# Patient Record
Sex: Female | Born: 1985
Health system: Southern US, Community
[De-identification: ages and names within clinical notes are randomized; demographics above are authoritative.]

## PROBLEM LIST (undated history)

## (undated) DIAGNOSIS — G43909 Migraine, unspecified, not intractable, without status migrainosus: Secondary | ICD-10-CM

## (undated) DIAGNOSIS — O139 Gestational [pregnancy-induced] hypertension without significant proteinuria, unspecified trimester: Secondary | ICD-10-CM

## (undated) DIAGNOSIS — T7840XA Allergy, unspecified, initial encounter: Secondary | ICD-10-CM

## (undated) HISTORY — DX: Allergy, unspecified, initial encounter: T78.40XA

## (undated) HISTORY — DX: Migraine, unspecified, not intractable, without status migrainosus: G43.909

---

## 2009-06-16 DIAGNOSIS — O139 Gestational [pregnancy-induced] hypertension without significant proteinuria, unspecified trimester: Secondary | ICD-10-CM

## 2009-06-16 HISTORY — DX: Gestational (pregnancy-induced) hypertension without significant proteinuria, unspecified trimester: O13.9

## 2010-01-18 ENCOUNTER — Inpatient Hospital Stay (HOSPITAL_COMMUNITY): Admission: AD | Admit: 2010-01-18 | Discharge: 2010-01-19 | Payer: Self-pay | Admitting: Obstetrics and Gynecology

## 2010-04-25 ENCOUNTER — Inpatient Hospital Stay (HOSPITAL_COMMUNITY): Admission: AD | Admit: 2010-04-25 | Discharge: 2010-04-28 | Payer: Self-pay | Admitting: Obstetrics

## 2010-04-26 DIAGNOSIS — O139 Gestational [pregnancy-induced] hypertension without significant proteinuria, unspecified trimester: Secondary | ICD-10-CM

## 2010-08-27 LAB — CBC
HCT: 26.5 % — ABNORMAL LOW (ref 36.0–46.0)
HCT: 32.5 % — ABNORMAL LOW (ref 36.0–46.0)
HCT: 37.1 % (ref 36.0–46.0)
Hemoglobin: 12.9 g/dL (ref 12.0–15.0)
Hemoglobin: 9.3 g/dL — ABNORMAL LOW (ref 12.0–15.0)
Hemoglobin: 9.3 g/dL — ABNORMAL LOW (ref 12.0–15.0)
MCH: 32.7 pg (ref 26.0–34.0)
MCH: 32.7 pg (ref 26.0–34.0)
MCH: 33.3 pg (ref 26.0–34.0)
MCH: 33.5 pg (ref 26.0–34.0)
MCHC: 34.8 g/dL (ref 30.0–36.0)
MCHC: 34.9 g/dL (ref 30.0–36.0)
MCV: 93.8 fL (ref 78.0–100.0)
MCV: 95.9 fL (ref 78.0–100.0)
Platelets: 102 K/uL — ABNORMAL LOW (ref 150–400)
Platelets: 155 K/uL (ref 150–400)
RBC: 2.77 MIL/uL — ABNORMAL LOW (ref 3.87–5.11)
RBC: 3.96 MIL/uL (ref 3.87–5.11)
RDW: 12.6 % (ref 11.5–15.5)
RDW: 12.6 % (ref 11.5–15.5)
WBC: 14.2 K/uL — ABNORMAL HIGH (ref 4.0–10.5)
WBC: 16.4 K/uL — ABNORMAL HIGH (ref 4.0–10.5)
WBC: 18.7 10*3/uL — ABNORMAL HIGH (ref 4.0–10.5)

## 2010-08-27 LAB — COMPREHENSIVE METABOLIC PANEL WITH GFR
ALT: 17 U/L (ref 0–35)
ALT: 17 U/L (ref 0–35)
AST: 23 U/L (ref 0–37)
AST: 27 U/L (ref 0–37)
Albumin: 2.2 g/dL — ABNORMAL LOW (ref 3.5–5.2)
Albumin: 3 g/dL — ABNORMAL LOW (ref 3.5–5.2)
Alkaline Phosphatase: 135 U/L — ABNORMAL HIGH (ref 39–117)
Alkaline Phosphatase: 81 U/L (ref 39–117)
BUN: 3 mg/dL — ABNORMAL LOW (ref 6–23)
BUN: 9 mg/dL (ref 6–23)
CO2: 19 meq/L (ref 19–32)
CO2: 20 meq/L (ref 19–32)
Calcium: 8.4 mg/dL (ref 8.4–10.5)
Calcium: 9 mg/dL (ref 8.4–10.5)
Chloride: 107 meq/L (ref 96–112)
Chloride: 110 meq/L (ref 96–112)
Creatinine, Ser: 0.49 mg/dL (ref 0.4–1.2)
Creatinine, Ser: 0.65 mg/dL (ref 0.4–1.2)
GFR calc non Af Amer: >60 mL/min
GFR calc non Af Amer: >60 mL/min
Glucose, Bld: 70 mg/dL (ref 70–99)
Glucose, Bld: 78 mg/dL (ref 70–99)
Potassium: 3.8 meq/L (ref 3.5–5.1)
Potassium: 4.3 meq/L (ref 3.5–5.1)
Sodium: 136 meq/L (ref 135–145)
Sodium: 140 meq/L (ref 135–145)
Total Bilirubin: 0.4 mg/dL (ref 0.3–1.2)
Total Bilirubin: 0.5 mg/dL (ref 0.3–1.2)
Total Protein: 4.8 g/dL — ABNORMAL LOW (ref 6.0–8.3)
Total Protein: 6.2 g/dL (ref 6.0–8.3)

## 2010-08-27 LAB — COMPREHENSIVE METABOLIC PANEL
AST: 32 U/L (ref 0–37)
Albumin: 2.2 g/dL — ABNORMAL LOW (ref 3.5–5.2)
BUN: 8 mg/dL (ref 6–23)
CO2: 19 mEq/L (ref 19–32)
Chloride: 105 mEq/L (ref 96–112)
Creatinine, Ser: 0.64 mg/dL (ref 0.4–1.2)
GFR calc Af Amer: >60 mL/min (ref >60)
Potassium: 3.7 mEq/L (ref 3.5–5.1)
Sodium: 134 mEq/L — ABNORMAL LOW (ref 135–145)

## 2010-08-27 LAB — URIC ACID
Uric Acid, Serum: 4.6 mg/dL (ref 2.4–7.0)
Uric Acid, Serum: 5.1 mg/dL (ref 2.4–7.0)
Uric Acid, Serum: 5.1 mg/dL (ref 2.4–7.0)

## 2010-08-27 LAB — LACTATE DEHYDROGENASE: LDH: 161 U/L (ref 94–250)

## 2010-08-27 LAB — SYPHILIS: RPR W/REFLEX TO RPR TITER AND TREPONEMAL ANTIBODIES, TRADITIONAL SCREENING AND DIAGNOSIS ALGORITHM: RPR Ser Ql: NONREACTIVE

## 2010-08-30 LAB — COMPREHENSIVE METABOLIC PANEL
ALT: 18 U/L (ref 0–35)
Alkaline Phosphatase: 64 U/L (ref 39–117)
CO2: 21 mEq/L (ref 19–32)
Calcium: 8.6 mg/dL (ref 8.4–10.5)
Chloride: 105 mEq/L (ref 96–112)
Creatinine, Ser: 0.59 mg/dL (ref 0.4–1.2)
GFR calc non Af Amer: >60 mL/min (ref >60)
Potassium: 3.7 mEq/L (ref 3.5–5.1)
Sodium: 132 mEq/L — ABNORMAL LOW (ref 135–145)
Total Bilirubin: 0.5 mg/dL (ref 0.3–1.2)

## 2010-08-30 LAB — DIFFERENTIAL
Basophils Absolute: 0 10*3/uL (ref 0.0–0.1)
Basophils Relative: 0 % (ref 0–1)
Lymphs Abs: 1.4 10*3/uL (ref 0.7–4.0)
Monocytes Relative: 3 % (ref 3–12)

## 2010-08-30 LAB — CBC: MCH: 33.9 pg (ref 26.0–34.0)

## 2012-10-14 LAB — HM PAP SMEAR

## 2015-11-16 ENCOUNTER — Ambulatory Visit (INDEPENDENT_AMBULATORY_CARE_PROVIDER_SITE_OTHER): Payer: BLUE CROSS/BLUE SHIELD | Admitting: Physician Assistant

## 2015-11-16 ENCOUNTER — Encounter: Payer: Self-pay | Admitting: Physician Assistant

## 2015-11-16 VITALS — BP 124/84 | HR 75 | Ht 62.0 in | Wt 131.0 lb

## 2015-11-16 DIAGNOSIS — G43009 Migraine without aura, not intractable, without status migrainosus: Secondary | ICD-10-CM | POA: Insufficient documentation

## 2015-11-16 DIAGNOSIS — R0602 Shortness of breath: Secondary | ICD-10-CM | POA: Diagnosis not present

## 2015-11-16 DIAGNOSIS — F411 Generalized anxiety disorder: Secondary | ICD-10-CM | POA: Diagnosis not present

## 2015-11-16 MED ORDER — CITALOPRAM HYDROBROMIDE 10 MG PO TABS: 10.0000 mg | ORAL_TABLET | Freq: Every day | ORAL | Status: DC

## 2015-11-16 MED ORDER — ALBUTEROL SULFATE HFA 108 (90 BASE) MCG/ACT IN AERS: 2.0000 | INHALATION_SPRAY | Freq: Four times a day (QID) | RESPIRATORY_TRACT | Status: DC | PRN

## 2015-11-16 MED ORDER — HYDROXYZINE HCL 10 MG PO TABS: 10.0000 mg | ORAL_TABLET | Freq: Three times a day (TID) | ORAL | Status: DC | PRN

## 2015-11-16 MED ORDER — ELETRIPTAN HYDROBROMIDE 20 MG PO TABS: 20.0000 mg | ORAL_TABLET | ORAL | Status: DC | PRN

## 2015-11-16 NOTE — Progress Notes (Signed)
Subjective:    Patient ID: Crystal Armstrong, female    DOB: [birth year], 30 y.o.   MRN: [id number]  HPI  Patient is a 30 year old female who presents to the clinic to establish care.  .. Active Ambulatory Problems    Diagnosis Date Noted  . No Active Ambulatory Problems   Resolved Ambulatory Problems    Diagnosis Date Noted  . No Resolved Ambulatory Problems   Past Medical History  Diagnosis Date  . Allergy   . Migraine    .Marland Kitchen. Family History  Problem Relation Age of Onset  . Alcohol abuse Mother   . Hypertension Mother   . Cancer Father     bladder  . Hyperlipidemia Father   . Hypertension Father   . Hyperlipidemia Sister   . Diabetes Maternal Uncle   . Heart attack Paternal Aunt   . Heart attack Paternal Uncle   . Stroke Paternal Uncle   . Alcohol abuse Maternal Grandfather   . Heart attack Paternal Uncle   . Stroke Paternal Uncle   . Stroke Paternal Uncle    .Marland Kitchen. Social History   Social History  . Marital Status: Married    Spouse Name: N/A  . Number of Children: N/A  . Years of Education: N/A   Occupational History  . Not on file.   Social History Main Topics  . Smoking status: Never Smoker   . Smokeless tobacco: Not on file  . Alcohol Use: 0.0 oz/week    0 Standard drinks or equivalent per week  . Drug Use: No  . Sexual Activity: Yes   Other Topics Concern  . Not on file   Social History Narrative  . No narrative on file   She comes in wishing to switch her rescue medication from Imitrex to Relpax. Imitrex seems to make her head feel weird and her throat feels scratchy before the headache goes away. She tried a friend's Relpax and worked great with no symptoms. She proximally has 2 headaches a month. She denies any aura. She does have some nausea associated with headache. Her headaches seem to be around her ovulation and menstrual cycle. She does not actually bleed because she has the Mirena. Migraines are behind left eye.  Patient is also  concerned she could have some asthma. She does not have daily symptoms but occasionally will have some shortness of breath and chest tightness. Sometimes is associated with exercise. She has never used an inhaler or been tested for asthma. She denies any wheezing or shortness of breath today.  Patient is also having some anxiety symptoms as well. She denies any suicidal or homicidal thoughts. She denies any acute symptoms sound like she is dying. Most of her anxiety is around getting into the car for long road trips or the fear of things going wrong at work when she is not there. She has never taken any medication for anxiety. She is concerned because her mother has a addictive personality and abuse potential. She would like to stay away from controlled substances if she can.    Review of Systems  All other systems reviewed and are negative.      Objective:   Physical Exam  Constitutional: She is oriented to person, place, and time. She appears well-developed and well-nourished.  HENT:  Head: Normocephalic and atraumatic.  Cardiovascular: Normal rate, regular rhythm and normal heart sounds.   Pulmonary/Chest: Effort normal and breath sounds normal. She has no wheezes.  Neurological: She is alert and oriented  to person, place, and time.  Psychiatric: She has a normal mood and affect. Her behavior is normal.          Assessment & Plan:  Migraines- relpax to replace imitrex. Follow up as needed.   Anxiety- PHQ-9 was 4. GAD-7 was 3. vistaril given as needed. Avoid benzo as needed due to mother's addictive personality.  celexa  at bedtime started. MOA's and SE's discussed. Follow up in 4-6weeks.   SOB- peak flows were normal. Could have some episodic reactive airway disease. Given albuterol inhaler as needed. Discussed how to use.   Will get pap from [facility].

## 2015-11-20 ENCOUNTER — Encounter: Payer: Self-pay | Admitting: Physician Assistant

## 2015-12-26 ENCOUNTER — Ambulatory Visit: Payer: BLUE CROSS/BLUE SHIELD | Admitting: Physician Assistant

## 2016-01-03 ENCOUNTER — Ambulatory Visit (INDEPENDENT_AMBULATORY_CARE_PROVIDER_SITE_OTHER): Payer: BLUE CROSS/BLUE SHIELD | Admitting: Physician Assistant

## 2016-01-03 ENCOUNTER — Encounter: Payer: Self-pay | Admitting: Physician Assistant

## 2016-01-03 VITALS — BP 113/79 | HR 72 | Ht 62.0 in | Wt 127.0 lb

## 2016-01-03 DIAGNOSIS — Z13220 Encounter for screening for lipoid disorders: Secondary | ICD-10-CM

## 2016-01-03 DIAGNOSIS — R110 Nausea: Secondary | ICD-10-CM | POA: Diagnosis not present

## 2016-01-03 DIAGNOSIS — F411 Generalized anxiety disorder: Secondary | ICD-10-CM | POA: Diagnosis not present

## 2016-01-03 DIAGNOSIS — Z131 Encounter for screening for diabetes mellitus: Secondary | ICD-10-CM | POA: Diagnosis not present

## 2016-01-03 LAB — COMPLETE METABOLIC PANEL WITH GFR
ALBUMIN: 4.5 g/dL (ref 3.6–5.1)
ALK PHOS: 60 U/L (ref 33–115)
ALT: 10 U/L (ref 6–29)
AST: 13 U/L (ref 10–30)
BILIRUBIN TOTAL: 0.8 mg/dL (ref 0.2–1.2)
BUN: 8 mg/dL (ref 7–25)
CALCIUM: 9.3 mg/dL (ref 8.6–10.2)
CO2: 26 mmol/L (ref 20–31)
CREATININE: 0.91 mg/dL (ref 0.50–1.10)
Chloride: 105 mmol/L (ref 98–110)
GFR, EST NON AFRICAN AMERICAN: 85 mL/min (ref >=60)
Glucose, Bld: 88 mg/dL (ref 65–99)
Potassium: 4.4 mmol/L (ref 3.5–5.3)
Sodium: 140 mmol/L (ref 135–146)
TOTAL PROTEIN: 7.5 g/dL (ref 6.1–8.1)

## 2016-01-03 LAB — TSH: TSH: 1.73 mIU/L

## 2016-01-03 LAB — LIPID PANEL
CHOLESTEROL: 209 mg/dL — AB (ref 125–200)
HDL: 50 mg/dL (ref >=46)
LDL CALC: 128 mg/dL (ref <130)
TRIGLYCERIDES: 155 mg/dL — AB (ref <150)
Total CHOL/HDL Ratio: 4.2 Ratio (ref <=5.0)
VLDL: 31 mg/dL — ABNORMAL HIGH (ref <30)

## 2016-01-03 MED ORDER — CITALOPRAM HYDROBROMIDE 10 MG PO TABS: 10.0000 mg | ORAL_TABLET | Freq: Every day | ORAL | Status: DC

## 2016-01-03 MED ORDER — ONDANSETRON HCL 4 MG PO TABS: 4.0000 mg | ORAL_TABLET | Freq: Three times a day (TID) | ORAL | Status: DC | PRN

## 2016-01-03 NOTE — Progress Notes (Addendum)
   Subjective:    Patient ID: Crystal Armstrong, female    DOB: 04-23-1986, 30 y.o.   MRN: [id number]  HPI Pt is a 30 year old female that returns to clinic for follow up of her anxiety. She was started on Clexa 6 weeks ago. She reports taking the medication regularly at night and has noticed an improvement in mood. She denies any side effects. She reports some trouble falling asleep at night but is able to stay asleep through the night.   She also complains of nausea without vomiting during migraines. She states her migraine pain is well managed with Relpax.   Review of Systems All systems negative unless otherwise stated in the HPI    Objective:   Physical Exam  Constitutional: She is oriented to person, place, and time. She appears well-developed and well-nourished.  HENT:  Head: Normocephalic and atraumatic.  Cardiovascular: Normal rate, regular rhythm and normal heart sounds.   Pulmonary/Chest: Effort normal and breath sounds normal.  Neurological: She is alert and oriented to person, place, and time.  Psychiatric: She has a normal mood and affect. Her behavior is normal.          Assessment & Plan:  Anxiety/Insominia-  Improvement with Celexa. PHQ-9= 3. Gad-7= 4.  She has a prescription for vistaril that she has not tried. Explained that she can use the vistaril prn for nights when she is having anxiety falling asleep. Also suggested trying melatonin to aid sleep 3-10mg . Screening lipid panel, CMP and TSH ordered today. Follow up in 6 months or earlier if needed.  Nausea- associated with migraines. Migraines are well controlled with Relpax. Given Zofran to use prn with migraines

## 2016-01-04 ENCOUNTER — Encounter: Payer: Self-pay | Admitting: Physician Assistant

## 2016-01-04 DIAGNOSIS — E781 Pure hyperglyceridemia: Secondary | ICD-10-CM | POA: Insufficient documentation

## 2016-03-31 ENCOUNTER — Other Ambulatory Visit: Payer: Self-pay | Admitting: *Deleted

## 2016-03-31 MED ORDER — HYDROXYZINE HCL 10 MG PO TABS: 10.0000 mg | ORAL_TABLET | Freq: Three times a day (TID) | ORAL | 0 refills | Status: DC | PRN

## 2016-04-09 DIAGNOSIS — Z01419 Encounter for gynecological examination (general) (routine) without abnormal findings: Secondary | ICD-10-CM | POA: Diagnosis not present

## 2016-04-09 DIAGNOSIS — Z3202 Encounter for pregnancy test, result negative: Secondary | ICD-10-CM | POA: Diagnosis not present

## 2016-04-09 DIAGNOSIS — Z30432 Encounter for removal of intrauterine contraceptive device: Secondary | ICD-10-CM | POA: Diagnosis not present

## 2016-04-09 DIAGNOSIS — Z6825 Body mass index (BMI) 25.0-25.9, adult: Secondary | ICD-10-CM | POA: Diagnosis not present

## 2016-04-09 DIAGNOSIS — Z1151 Encounter for screening for human papillomavirus (HPV): Secondary | ICD-10-CM | POA: Diagnosis not present

## 2016-05-12 DIAGNOSIS — Z3201 Encounter for pregnancy test, result positive: Secondary | ICD-10-CM | POA: Diagnosis not present

## 2016-05-12 DIAGNOSIS — Z8759 Personal history of other complications of pregnancy, childbirth and the puerperium: Secondary | ICD-10-CM | POA: Diagnosis not present

## 2016-05-14 DIAGNOSIS — Z8759 Personal history of other complications of pregnancy, childbirth and the puerperium: Secondary | ICD-10-CM | POA: Diagnosis not present

## 2016-05-26 DIAGNOSIS — O020 Blighted ovum and nonhydatidiform mole: Secondary | ICD-10-CM | POA: Diagnosis not present

## 2016-06-02 DIAGNOSIS — Z3201 Encounter for pregnancy test, result positive: Secondary | ICD-10-CM | POA: Diagnosis not present

## 2016-06-02 DIAGNOSIS — Z8759 Personal history of other complications of pregnancy, childbirth and the puerperium: Secondary | ICD-10-CM | POA: Diagnosis not present

## 2016-06-24 DIAGNOSIS — Z113 Encounter for screening for infections with a predominantly sexual mode of transmission: Secondary | ICD-10-CM | POA: Diagnosis not present

## 2016-06-24 DIAGNOSIS — Z3A09 9 weeks gestation of pregnancy: Secondary | ICD-10-CM | POA: Diagnosis not present

## 2016-06-24 DIAGNOSIS — O26891 Other specified pregnancy related conditions, first trimester: Secondary | ICD-10-CM | POA: Diagnosis not present

## 2016-06-24 DIAGNOSIS — Z3689 Encounter for other specified antenatal screening: Secondary | ICD-10-CM | POA: Diagnosis not present

## 2016-06-24 DIAGNOSIS — Z368A Encounter for antenatal screening for other genetic defects: Secondary | ICD-10-CM | POA: Diagnosis not present

## 2016-06-24 DIAGNOSIS — Z3481 Encounter for supervision of other normal pregnancy, first trimester: Secondary | ICD-10-CM | POA: Diagnosis not present

## 2016-06-24 DIAGNOSIS — Z23 Encounter for immunization: Secondary | ICD-10-CM | POA: Diagnosis not present

## 2016-07-04 ENCOUNTER — Ambulatory Visit: Payer: BLUE CROSS/BLUE SHIELD | Admitting: Physician Assistant

## 2016-07-15 DIAGNOSIS — Z3682 Encounter for antenatal screening for nuchal translucency: Secondary | ICD-10-CM | POA: Diagnosis not present

## 2016-07-15 DIAGNOSIS — Z3A12 12 weeks gestation of pregnancy: Secondary | ICD-10-CM | POA: Diagnosis not present

## 2016-07-15 DIAGNOSIS — Z1379 Encounter for other screening for genetic and chromosomal anomalies: Secondary | ICD-10-CM | POA: Diagnosis not present

## 2016-08-12 DIAGNOSIS — Z36 Encounter for antenatal screening for chromosomal anomalies: Secondary | ICD-10-CM | POA: Diagnosis not present

## 2016-08-26 DIAGNOSIS — Z363 Encounter for antenatal screening for malformations: Secondary | ICD-10-CM | POA: Diagnosis not present

## 2016-08-26 DIAGNOSIS — Z3A18 18 weeks gestation of pregnancy: Secondary | ICD-10-CM | POA: Diagnosis not present

## 2016-09-09 DIAGNOSIS — O139 Gestational [pregnancy-induced] hypertension without significant proteinuria, unspecified trimester: Secondary | ICD-10-CM | POA: Diagnosis not present

## 2016-10-14 DIAGNOSIS — O139 Gestational [pregnancy-induced] hypertension without significant proteinuria, unspecified trimester: Secondary | ICD-10-CM | POA: Diagnosis not present

## 2016-10-14 DIAGNOSIS — Z36 Encounter for antenatal screening for chromosomal anomalies: Secondary | ICD-10-CM | POA: Diagnosis not present

## 2016-10-24 DIAGNOSIS — Z3483 Encounter for supervision of other normal pregnancy, third trimester: Secondary | ICD-10-CM | POA: Diagnosis not present

## 2016-10-24 DIAGNOSIS — Z3689 Encounter for other specified antenatal screening: Secondary | ICD-10-CM | POA: Diagnosis not present

## 2016-10-31 ENCOUNTER — Inpatient Hospital Stay (HOSPITAL_COMMUNITY): Payer: BLUE CROSS/BLUE SHIELD

## 2016-10-31 ENCOUNTER — Inpatient Hospital Stay (HOSPITAL_COMMUNITY)
Admission: AD | Admit: 2016-10-31 | Discharge: 2016-11-05 | DRG: 765 | Disposition: A | Discharge status: Home / Self Care | Payer: BLUE CROSS/BLUE SHIELD | Source: Ambulatory Visit | Attending: Obstetrics and Gynecology | Admitting: Obstetrics and Gynecology

## 2016-10-31 ENCOUNTER — Encounter: Payer: Self-pay | Admitting: Student

## 2016-10-31 DIAGNOSIS — O1494 Unspecified pre-eclampsia, complicating childbirth: Secondary | ICD-10-CM | POA: Diagnosis not present

## 2016-10-31 DIAGNOSIS — O1002 Pre-existing essential hypertension complicating childbirth: Secondary | ICD-10-CM | POA: Diagnosis present

## 2016-10-31 DIAGNOSIS — Z3A28 28 weeks gestation of pregnancy: Secondary | ICD-10-CM

## 2016-10-31 DIAGNOSIS — Z8249 Family history of ischemic heart disease and other diseases of the circulatory system: Secondary | ICD-10-CM

## 2016-10-31 DIAGNOSIS — O1493 Unspecified pre-eclampsia, third trimester: Secondary | ICD-10-CM | POA: Diagnosis not present

## 2016-10-31 DIAGNOSIS — D696 Thrombocytopenia, unspecified: Secondary | ICD-10-CM | POA: Diagnosis present

## 2016-10-31 DIAGNOSIS — Z833 Family history of diabetes mellitus: Secondary | ICD-10-CM

## 2016-10-31 DIAGNOSIS — Z302 Encounter for sterilization: Secondary | ICD-10-CM

## 2016-10-31 DIAGNOSIS — O133 Gestational [pregnancy-induced] hypertension without significant proteinuria, third trimester: Secondary | ICD-10-CM | POA: Diagnosis not present

## 2016-10-31 DIAGNOSIS — O1403 Mild to moderate pre-eclampsia, third trimester: Secondary | ICD-10-CM | POA: Diagnosis not present

## 2016-10-31 DIAGNOSIS — O9912 Other diseases of the blood and blood-forming organs and certain disorders involving the immune mechanism complicating childbirth: Secondary | ICD-10-CM | POA: Diagnosis not present

## 2016-10-31 DIAGNOSIS — O328XX0 Maternal care for other malpresentation of fetus, not applicable or unspecified: Secondary | ICD-10-CM | POA: Diagnosis present

## 2016-10-31 DIAGNOSIS — O113 Pre-existing hypertension with pre-eclampsia, third trimester: Secondary | ICD-10-CM | POA: Diagnosis not present

## 2016-10-31 DIAGNOSIS — O114 Pre-existing hypertension with pre-eclampsia, complicating childbirth: Principal | ICD-10-CM | POA: Diagnosis present

## 2016-10-31 DIAGNOSIS — Z98891 History of uterine scar from previous surgery: Secondary | ICD-10-CM

## 2016-10-31 DIAGNOSIS — O119 Pre-existing hypertension with pre-eclampsia, unspecified trimester: Secondary | ICD-10-CM | POA: Diagnosis present

## 2016-10-31 DIAGNOSIS — O321XX0 Maternal care for breech presentation, not applicable or unspecified: Secondary | ICD-10-CM | POA: Diagnosis not present

## 2016-10-31 DIAGNOSIS — O1203 Gestational edema, third trimester: Secondary | ICD-10-CM | POA: Diagnosis not present

## 2016-10-31 DIAGNOSIS — O1490 Unspecified pre-eclampsia, unspecified trimester: Secondary | ICD-10-CM

## 2016-10-31 HISTORY — DX: Gestational (pregnancy-induced) hypertension without significant proteinuria, unspecified trimester: O13.9

## 2016-10-31 LAB — CBC
HEMATOCRIT: 36 % (ref 36.0–46.0)
HEMOGLOBIN: 12.8 g/dL (ref 12.0–15.0)
MCH: 32 pg (ref 26.0–34.0)
MCHC: 35.6 g/dL (ref 30.0–36.0)
MCV: 90 fL (ref 78.0–100.0)
Platelets: 105 10*3/uL — ABNORMAL LOW (ref 150–400)
RBC: 4 MIL/uL (ref 3.87–5.11)
RDW: 12.3 % (ref 11.5–15.5)
WBC: 16 10*3/uL — AB (ref 4.0–10.5)

## 2016-10-31 LAB — COMPREHENSIVE METABOLIC PANEL
ALBUMIN: 2.6 g/dL — AB (ref 3.5–5.0)
ALK PHOS: 72 U/L (ref 38–126)
ALT: 25 U/L (ref 14–54)
AST: 34 U/L (ref 15–41)
Anion gap: 11 (ref 5–15)
BILIRUBIN TOTAL: 0.6 mg/dL (ref 0.3–1.2)
BUN: 16 mg/dL (ref 6–20)
CO2: 19 mmol/L — ABNORMAL LOW (ref 22–32)
CREATININE: 0.9 mg/dL (ref 0.44–1.00)
Calcium: 8.5 mg/dL — ABNORMAL LOW (ref 8.9–10.3)
Chloride: 103 mmol/L (ref 101–111)
GFR calc Af Amer: >60 mL/min (ref >60)
GLUCOSE: 87 mg/dL (ref 65–99)
POTASSIUM: 4 mmol/L (ref 3.5–5.1)
Sodium: 133 mmol/L — ABNORMAL LOW (ref 135–145)
TOTAL PROTEIN: 6.3 g/dL — AB (ref 6.5–8.1)

## 2016-10-31 LAB — TYPE AND SCREEN
ABO/RH(D): A POS
Antibody Screen: NEGATIVE

## 2016-10-31 LAB — PROTEIN / CREATININE RATIO, URINE
CREATININE, URINE: 232 mg/dL
Protein Creatinine Ratio: 3.25 mg/mg{Cre} — ABNORMAL HIGH (ref 0.00–0.15)
Total Protein, Urine: 755 mg/dL

## 2016-10-31 MED ORDER — LABETALOL HCL 200 MG PO TABS: 200.0000 mg | ORAL_TABLET | Freq: Once | ORAL | Status: DC

## 2016-10-31 MED ORDER — PRENATAL MULTIVITAMIN CH
1.0000 | ORAL_TABLET | Freq: Every day | ORAL | Status: DC
  Administered 2016-11-01: 1 via ORAL
  Filled 2016-10-31: qty 1

## 2016-10-31 MED ORDER — CALCIUM CARBONATE ANTACID 500 MG PO CHEW
2.0000 | CHEWABLE_TABLET | ORAL | Status: DC | PRN
  Administered 2016-11-01: 400 mg via ORAL
  Filled 2016-10-31: qty 2

## 2016-10-31 MED ORDER — HYDRALAZINE HCL 20 MG/ML IJ SOLN: 10.0000 mg | Freq: Once | INTRAMUSCULAR | Status: DC | PRN

## 2016-10-31 MED ORDER — BETAMETHASONE SOD PHOS & ACET 6 (3-3) MG/ML IJ SUSP
12.0000 mg | Freq: Once | INTRAMUSCULAR | Status: AC
  Administered 2016-10-31: 12 mg via INTRAMUSCULAR
  Filled 2016-10-31: qty 2

## 2016-10-31 MED ORDER — LABETALOL HCL 200 MG PO TABS
200.0000 mg | ORAL_TABLET | Freq: Three times a day (TID) | ORAL | Status: DC
  Administered 2016-10-31 – 2016-11-02 (×5): 200 mg via ORAL
  Filled 2016-10-31 (×5): qty 2

## 2016-10-31 MED ORDER — ZOLPIDEM TARTRATE 5 MG PO TABS
5.0000 mg | ORAL_TABLET | Freq: Every evening | ORAL | Status: DC | PRN
  Administered 2016-11-01: 5 mg via ORAL
  Filled 2016-10-31: qty 1

## 2016-10-31 MED ORDER — SODIUM CHLORIDE 0.9% FLUSH
3.0000 mL | Freq: Two times a day (BID) | INTRAVENOUS | Status: DC
  Administered 2016-11-01 (×2): 3 mL via INTRAVENOUS

## 2016-10-31 MED ORDER — LABETALOL HCL 5 MG/ML IV SOLN
20.0000 mg | INTRAVENOUS | Status: DC | PRN
  Administered 2016-11-01: 20 mg via INTRAVENOUS

## 2016-10-31 MED ORDER — ACETAMINOPHEN 325 MG PO TABS: 650.0000 mg | ORAL_TABLET | ORAL | Status: DC | PRN

## 2016-10-31 MED ORDER — LACTATED RINGERS IV SOLN
INTRAVENOUS | Status: DC
Start: 2016-10-31 — End: 2016-11-02
  Administered 2016-10-31 – 2016-11-01 (×2): via INTRAVENOUS

## 2016-10-31 MED ORDER — LABETALOL HCL 5 MG/ML IV SOLN
20.0000 mg | INTRAVENOUS | Status: DC | PRN
  Administered 2016-10-31: 20 mg via INTRAVENOUS
  Administered 2016-10-31: 40 mg via INTRAVENOUS
  Filled 2016-10-31: qty 4
  Filled 2016-10-31: qty 8
  Filled 2016-10-31: qty 4

## 2016-10-31 MED ORDER — DOCUSATE SODIUM 100 MG PO CAPS
100.0000 mg | ORAL_CAPSULE | Freq: Every day | ORAL | Status: DC
  Filled 2016-10-31: qty 1

## 2016-10-31 MED ORDER — BETAMETHASONE SOD PHOS & ACET 6 (3-3) MG/ML IJ SUSP: 12.0000 mg | INTRAMUSCULAR | Status: DC

## 2016-10-31 MED ORDER — SODIUM CHLORIDE 0.9% FLUSH: 3.0000 mL | INTRAVENOUS | Status: DC | PRN

## 2016-10-31 MED ORDER — SODIUM CHLORIDE 0.9 % IV SOLN: 250.0000 mL | INTRAVENOUS | Status: DC | PRN

## 2016-10-31 NOTE — MAU Provider Note (Signed)
History     CSN: [id number]  Arrival date and time: 10/31/16 1308   First Provider Initiated Contact with Patient 10/31/16 1913     Chief Complaint  Patient presents with  . Pre-Eclampsia   HPI  Crystal Armstrong is a 31 y.o. G3P1011 at 36w1dwho presents from the office for preeclampsia evaluation. Pt has history of gestational hypertension in previous pregnancy. Denies hypertension outside of pregnancy. Has been followed for elevated BPs since 20 weeks. Had labs done today that were abnormal & was sent here for further evaluation. Reports 12# weight gain in the last week & had noticed increased swelling of her legs over the last several days.  Denies headache, visual disturbance, epigastric pain, CP, or SOB. Positive fetal movement.   OB History    Gravida Para Term Preterm AB Living   '3 1 1 '$ 0 1 1   SAB TAB Ectopic Multiple Live Births   1       1      Past Medical History:  Diagnosis Date  . Allergy   . Migraine   . Pregnancy induced hypertension 2011    History reviewed. No pertinent surgical history.  Family History  Problem Relation Age of Onset  . Alcohol abuse Mother   . Hypertension Mother   . Cancer Father        bladder  . Hyperlipidemia Father   . Hypertension Father   . Hyperlipidemia Sister   . Diabetes Maternal Uncle   . Heart attack Paternal Aunt   . Heart attack Paternal Uncle   . Stroke Paternal Uncle   . Alcohol abuse Maternal Grandfather   . Heart attack Paternal Uncle   . Stroke Paternal Uncle   . Stroke Paternal Uncle     Social History  Substance Use Topics  . Smoking status: Never Smoker  . Smokeless tobacco: Never Used  . Alcohol use 0.0 oz/week     Comment: rare    Allergies: No Known Allergies  Prescriptions Prior to Admission  Medication Sig Dispense Refill Last Dose  . fexofenadine (ALLEGRA) 30 MG tablet Take 30 mg by mouth daily.   Taking  . labetalol (NORMODYNE) 200 MG tablet Take 200 mg by mouth 2 (two) times daily.  2    . Prenatal Vit-Fe Fumarate-FA (PRENATAL VITAMIN PO) Take 1 tablet by mouth daily.     . [DISCONTINUED] albuterol (PROVENTIL HFA;VENTOLIN HFA) 108 (90 Base) MCG/ACT inhaler Inhale 2 puffs into the lungs every 6 (six) hours as needed for wheezing or shortness of breath. 1 Inhaler 2 Taking  . [DISCONTINUED] citalopram (CELEXA) 10 MG tablet Take 1 tablet (10 mg total) by mouth daily. 90 tablet 1   . [DISCONTINUED] eletriptan (RELPAX) 20 MG tablet Take 1 tablet (20 mg total) by mouth as needed for migraine or headache. May repeat in 2 hours if headache persists or recurs. 10 tablet 5 Taking  . [DISCONTINUED] hydrOXYzine (ATARAX/VISTARIL) 10 MG tablet Take 1 tablet (10 mg total) by mouth every 8 (eight) hours as needed for anxiety. 30 tablet 0   . [DISCONTINUED] levonorgestrel (MIRENA) 20 MCG/24HR IUD 1 each by Intrauterine route once.   Taking  . [DISCONTINUED] ondansetron (ZOFRAN) 4 MG tablet Take 1 tablet (4 mg total) by mouth every 8 (eight) hours as needed for nausea or vomiting. Associated with migraine. 20 tablet 1     Review of Systems  Constitutional: Negative.   Eyes: Negative for visual disturbance.  Respiratory: Negative for shortness of breath.  Cardiovascular: Positive for leg swelling. Negative for chest pain.  Gastrointestinal: Negative.   Genitourinary: Negative.   Neurological: Negative for headaches.   Physical Exam   Blood pressure (!) 151/85, pulse 86, temperature 98.2 F (36.8 C), temperature source Oral, resp. rate 16, height 5\' 2"  (1.575 m), weight 180 lb 4 oz (81.8 kg), SpO2 98 %.  Temp:  [98.2 F (36.8 C)] 98.2 F (36.8 C) (05/18 1841) Pulse Rate:  [77-98] 86 (05/18 2045) Resp:  [16] 16 (05/18 1841) BP: (150-186)/(82-103) 151/85 (05/18 2045) SpO2:  [97 %-100 %] 98 % (05/18 2045) Weight:  [180 lb 4 oz (81.8 kg)] 180 lb 4 oz (81.8 kg) (05/18 1823)  Physical Exam  Nursing note and vitals reviewed. Constitutional: She is oriented to person, place, and time. She  appears well-developed and well-nourished. No distress.  HENT:  Head: Normocephalic and atraumatic.  Eyes: Conjunctivae are normal. Right eye exhibits no discharge. Left eye exhibits no discharge. No scleral icterus.  Neck: Normal range of motion.  Cardiovascular: Normal rate, regular rhythm and normal heart sounds.   No murmur heard. Respiratory: Effort normal and breath sounds normal. No respiratory distress. She has no wheezes.  GI: Soft. There is no tenderness.  Musculoskeletal: She exhibits edema (3+ pitting edema of BLE).  Neurological: She is alert and oriented to person, place, and time.  Reflex Scores:      Patellar reflexes are 3+ on the right side and 3+ on the left side. 2 beat clonus bilaterally  Skin: Skin is warm and dry. She is not diaphoretic.  Psychiatric: She has a normal mood and affect. Her behavior is normal. Judgment and thought content normal.   Fetal Tracing:  Baseline: 145 Variability: moderate Accelerations: 10x10 Decelerations: variable  Toco: none MAU Course  Procedures Results for orders placed or performed during the hospital encounter of 10/31/16 (from the past 48 hour(s))  CBC     Status: Abnormal   Collection Time: 10/31/16  6:43 PM  Result Value Ref Range   WBC 16.0 (H) 4.0 - 10.5 K/uL   RBC 4.00 3.87 - 5.11 MIL/uL   Hemoglobin 12.8 12.0 - 15.0 g/dL   HCT 11/02/16 18.8 - 41.6 %   MCV 90.0 78.0 - 100.0 fL   MCH 32.0 26.0 - 34.0 pg   MCHC 35.6 30.0 - 36.0 g/dL   RDW 60.6 30.1 - 60.1 %   Platelets 105 (L) 150 - 400 K/uL    Comment: REPEATED TO VERIFY SPECIMEN CHECKED FOR CLOTS PLATELET COUNT CONFIRMED BY SMEAR   Comprehensive metabolic panel     Status: Abnormal   Collection Time: 10/31/16  6:43 PM  Result Value Ref Range   Sodium 133 (L) 135 - 145 mmol/L   Potassium 4.0 3.5 - 5.1 mmol/L   Chloride 103 101 - 111 mmol/L   CO2 19 (L) 22 - 32 mmol/L   Glucose, Bld 87 65 - 99 mg/dL   BUN 16 6 - 20 mg/dL   Creatinine, Ser 11/02/16 0.44 - 1.00  mg/dL   Calcium 8.5 (L) 8.9 - 10.3 mg/dL   Total Protein 6.3 (L) 6.5 - 8.1 g/dL   Albumin 2.6 (L) 3.5 - 5.0 g/dL   AST 34 15 - 41 U/L   ALT 25 14 - 54 U/L   Alkaline Phosphatase 72 38 - 126 U/L   Total Bilirubin 0.6 0.3 - 1.2 mg/dL   GFR calc non Af Amer >60 >60 mL/min   GFR calc Af Amer >60 >60 mL/min  Comment: (NOTE) The eGFR has been calculated using the CKD EPI equation. This calculation has not been validated in all clinical situations. eGFR's persistently <60 mL/min signify possible Chronic Kidney Disease.    Anion gap 11 5 - 15    MDM Labetalol protocol ordered for severe range BPs CBC, CMP, urine PCR BMZ #1 given BP responded to 1 dose of labetalol ('20mg'$ ) Urine PCR pending Dr. Marylu Lund in MAU to discuss plan of care with patient Assessment and Plan  A; 1. Preeclampsia   2. [redacted] weeks gestation of pregnancy    P; Dr. Marvel Plan here to admit patient  Jorje Guild 10/31/2016, 7:14 PM

## 2016-10-31 NOTE — H&P (Signed)
Crystal Armstrong is a 31 y.o. female G3P1011 at 43 1/7 weeks (EDD 01/22/17 by 6 week [place] inconsistent with LMP)  presenting for admission given new onset preeclampsia with some intermittent severe range blood pressures and new proteinuria.   Prenatal care complicated by increasing BP since about 20 weeks, but not consistently. BP has ranged from 130-140/90-100 but today was 150-170/90-105.  In the office noted to have 4+ proteinuria and stat labs were remarkable for platelets of 96K (baseline 150-215K)  And creatinine of 1.06.  She had gestational hypertension last pregnancy but delivered at 38 weeks.    OB History    Gravida Para Term Preterm AB Living   3 1 1  0 1 1   SAB TAB Ectopic Multiple Live Births   1       1    2011 NSVD 6#13oz SABx1  Past Medical History:  Diagnosis Date  . Allergy   . Migraine   . Pregnancy induced hypertension 2011   History reviewed. No pertinent surgical history. Family History: family history includes Alcohol abuse in her maternal grandfather and mother; Cancer in her father; Diabetes in her maternal uncle; Heart attack in her paternal aunt, paternal uncle, and paternal uncle; Hyperlipidemia in her father and sister; Hypertension in her father and mother; Stroke in her paternal uncle, paternal uncle, and paternal uncle. Social History:  reports that she has never smoked. She has never used smokeless tobacco. She reports that she drinks alcohol. She reports that she does not use drugs.     Maternal Diabetes: No Genetic Screening: Normal   Panorama low risk, female Maternal Ultrasounds/Referrals: Normal Fetal Ultrasounds or other Referrals:  None Maternal Substance Abuse:  No Significant Maternal Medications:  None Significant Maternal Lab Results:  None Other Comments:  None  Review of Systems  Gastrointestinal: Negative for abdominal pain.  Neurological: Negative for headaches.   Maternal Medical History:  Contractions: Frequency: rare.   Perceived  severity is mild.    Fetal activity: Perceived fetal activity is normal.    Prenatal complications: PIH and pre-eclampsia.   Prenatal Complications - Diabetes: none.      Blood pressure (!) 151/85, pulse 86, temperature 98.2 F (36.8 C), temperature source Oral, resp. rate 16, height 5\' 2"  (1.575 m), weight 81.8 kg (180 lb 4 oz), SpO2 98 %. Maternal Exam:  Uterine Assessment: Contraction strength is mild.  Contraction frequency is rare.   Abdomen: Patient reports no abdominal tenderness. Introitus: Normal vulva. Normal vagina.    Physical Exam  Constitutional: She appears well-developed.  Cardiovascular: Normal rate and regular rhythm.   Respiratory: Effort normal.  GI: Soft.  Genitourinary: Vagina normal.  Musculoskeletal: She exhibits edema.  Neurological: She is alert.  Psychiatric: She has a normal mood and affect.    Labs repeated here and: Platelets  105K stable AST/ALT 34/25 Creatinine 0.90 Prot:creat ratio 3.25  Prenatal labs: ABO, Rh:  A Positive Antibody:  Negative Rubella: Immune RPR:   NR HBsAg:  Neg HIV:  NR  GBS:   unknown One hour GCT 114 Panorama low risk, female AFP negative Essential panel negative  Assessment/Plan: Pt with preeclampsia and severe range proteinuria.  Labs stable this PM and will repeat in AM.   BP a bit labile but most not in severe range.  Will start on labetalol 200 mg po TID and follow for response.Get [place] for growth and presentation. Pt already received first dose betamethasone and will repeat in 24 hours. NICU consult.  Will hold  off on magnesium for now unless other severe features develop or BP hard to control. D/w pt in detail need for in-house observation and probable early delivery, but will get as much time as possible for baby.  Oliver PilaKathy W Damaya Channing 10/31/2016, 9:03 PM

## 2016-10-31 NOTE — MAU Note (Signed)
[facility] with elevated BPs and protein in her urine.  She denies HA or visual changes, but states she has gained 12lbs in the past 1.5wks.  Also having epigastric pain.  Denies vaginal bleeding or contractions.

## 2016-11-01 LAB — COMPREHENSIVE METABOLIC PANEL WITH GFR
ALT: 24 U/L (ref 14–54)
ALT: 24 U/L (ref 14–54)
AST: 30 U/L (ref 15–41)
AST: 32 U/L (ref 15–41)
Albumin: 2.3 g/dL — ABNORMAL LOW (ref 3.5–5.0)
Albumin: 2.5 g/dL — ABNORMAL LOW (ref 3.5–5.0)
Alkaline Phosphatase: 69 U/L (ref 38–126)
Alkaline Phosphatase: 72 U/L (ref 38–126)
Anion gap: 8 (ref 5–15)
Anion gap: 9 (ref 5–15)
BUN: 17 mg/dL (ref 6–20)
BUN: 20 mg/dL (ref 6–20)
CO2: 20 mmol/L — ABNORMAL LOW (ref 22–32)
CO2: 20 mmol/L — ABNORMAL LOW (ref 22–32)
Calcium: 8.6 mg/dL — ABNORMAL LOW (ref 8.9–10.3)
Calcium: 9.7 mg/dL (ref 8.9–10.3)
Chloride: 105 mmol/L (ref 101–111)
Chloride: 105 mmol/L (ref 101–111)
Creatinine, Ser: 0.81 mg/dL (ref 0.44–1.00)
Creatinine, Ser: 0.85 mg/dL (ref 0.44–1.00)
GFR calc Af Amer: >60 mL/min
GFR calc Af Amer: >60 mL/min
GFR calc non Af Amer: >60 mL/min
GFR calc non Af Amer: >60 mL/min
Glucose, Bld: 113 mg/dL — ABNORMAL HIGH (ref 65–99)
Glucose, Bld: 134 mg/dL — ABNORMAL HIGH (ref 65–99)
Potassium: 4.5 mmol/L (ref 3.5–5.1)
Potassium: 4.4 mmol/L (ref 3.5–5.1)
Sodium: 133 mmol/L — ABNORMAL LOW (ref 135–145)
Sodium: 134 mmol/L — ABNORMAL LOW (ref 135–145)
Total Bilirubin: 0.4 mg/dL (ref 0.3–1.2)
Total Bilirubin: 0.6 mg/dL (ref 0.3–1.2)
Total Protein: 5.4 g/dL — ABNORMAL LOW (ref 6.5–8.1)
Total Protein: 6.1 g/dL — ABNORMAL LOW (ref 6.5–8.1)

## 2016-11-01 LAB — CBC
HCT: 33.4 % — ABNORMAL LOW (ref 36.0–46.0)
HCT: 34.2 % — ABNORMAL LOW (ref 36.0–46.0)
HEMOGLOBIN: 11.9 g/dL — AB (ref 12.0–15.0)
Hemoglobin: 11.8 g/dL — ABNORMAL LOW (ref 12.0–15.0)
MCH: 32.4 pg (ref 26.0–34.0)
MCH: 31.8 pg (ref 26.0–34.0)
MCHC: 35.6 g/dL (ref 30.0–36.0)
MCHC: 34.5 g/dL (ref 30.0–36.0)
MCV: 91 fL (ref 78.0–100.0)
MCV: 92.2 fL (ref 78.0–100.0)
PLATELETS: 96 10*3/uL — AB (ref 150–400)
Platelets: 83 10*3/uL — ABNORMAL LOW (ref 150–400)
RBC: 3.67 MIL/uL — AB (ref 3.87–5.11)
RBC: 3.71 MIL/uL — ABNORMAL LOW (ref 3.87–5.11)
RDW: 12.4 % (ref 11.5–15.5)
RDW: 12.5 % (ref 11.5–15.5)
WBC: 17.9 10*3/uL — ABNORMAL HIGH (ref 4.0–10.5)
WBC: 23.8 10*3/uL — ABNORMAL HIGH (ref 4.0–10.5)

## 2016-11-01 LAB — ABO/RH: ABO/RH(D): A POS

## 2016-11-01 MED ORDER — BETAMETHASONE SOD PHOS & ACET 6 (3-3) MG/ML IJ SUSP
12.0000 mg | Freq: Once | INTRAMUSCULAR | Status: AC
  Administered 2016-11-01: 12 mg via INTRAMUSCULAR
  Filled 2016-11-01: qty 2

## 2016-11-01 MED ORDER — PANTOPRAZOLE SODIUM 20 MG PO TBEC
20.0000 mg | DELAYED_RELEASE_TABLET | Freq: Every day | ORAL | Status: DC
  Administered 2016-11-01 – 2016-11-05 (×4): 20 mg via ORAL
  Filled 2016-11-01 (×5): qty 1

## 2016-11-01 NOTE — Progress Notes (Signed)
Patient ID: Crystal Armstrong, female   DOB: [birth year], 31 y.o.   MRN: [id number] Repeat CBC with improved Plts to 96K.  All other labs remain WNL. Pt remains asymptomatic.  BP fairly well controlled on po labetalol, has needed one extra dose of IV coverage.   NICU has been to see pt.    Will recheck labs in AM and allow pt to eat tonight to try to get at least 24 hours from second dose of betamethasone.

## 2016-11-01 NOTE — Progress Notes (Signed)
Patient ID: Earnest Conroyshley M Meegan, female   DOB: [birth year], 31 y.o.   MRN: [id number] HD #2 Preeclampsia 28 2/7 weeks/thrombocytopenia  Pt feels well. Denies HA or any other PIH symptoms BP well-controlled on po labetalol 200mg  po TID  130-140/70-90  Labs this AM stable except platelets dropped to 83K  FHR reassuring Category 1 for gestational age  US last PM 1086 grams, breech  Abdomen gravid NT  Pt with preeclampsia with decreased platelets this AM.  D/w pt in detail that we will repeat these at 4 pm and if continuing to decrease will need to proceed with delivery by c-section.  Hopefully will remain stable and allow us to get another 24 hours of betamethasone before delivery.   Will give betamethasone early today and if looks like delivery will occur will begin magnesium sulfate for both the preeclampsia and CP prophylaxis. Anesthesia contacted and aware of situation.  NPO after noon until platelets return.  Will allow intermittent monitoring q shift for now.   NICU contacted and will come and consult with patient.

## 2016-11-01 NOTE — Consult Note (Signed)
[facility]  [facility] Consult       11/01/2016  4:40 PM  I was asked by Dr. Senaida Oresichardson to consult on this patient for possible preterm delivery.  I had the pleasure of meeting with Mrs. Crystal Armstrong and her husband today.  She is a 31 y/o G3P1011 at 7928 and 2/[redacted] weeks gestation who was admitted on 5/18 for new onset preeclampsia with severe range proteinuria and mild thrombocytopenia, though platelets are stable at 96k this afternoon (lowest was 83k this morning).  She has been started on labetalol and was given a first dose of betamethasone on 5/18 and 5/19.  The fetus is a female.    I explained that the neonatal intensive care team would be present for the delivery and outlined the likely delivery room course for this baby including routine resuscitation and NRP-guided approaches to the treatment of respiratory distress. We discussed other common problems associated with prematurity including respiratory distress syndrome/CLD, apnea, feeding issues, temperature regulation, and infection risk.  We briefly discussed IVH/PVL, ROP, and NEC and that these are complications associated with prematurity, but that by 30 weeks are uncommon.    We discussed the average length of stay but I noted that the actual LOS would depend on the severity of problems encountered and response to treatments.  We discussed visitation policies and the resources available while her child is in the hospital.  We discussed the importance of good nutrition and various methods of providing nutrition (parenteral hyperalimentation, gavage feedings and/or oral feeding). We discussed the benefits of human milk. I encouraged breast feeding and pumping soon after birth and outlined resources that are available to support breast feeding.  She does intend on providing breast milk.    Thank you for involving us in the care of this patient. A member of our team will be available should the family have additional questions.  Time  for consultation approximately 45 minutes.   _____________________ Electronically Signed By: Maryan CharLindsey Alantra Popoca, MD Neonatologist

## 2016-11-02 ENCOUNTER — Inpatient Hospital Stay (HOSPITAL_COMMUNITY): Payer: BLUE CROSS/BLUE SHIELD | Admitting: Anesthesiology

## 2016-11-02 ENCOUNTER — Encounter (HOSPITAL_COMMUNITY)
Admission: AD | Disposition: A | Discharge status: Home / Self Care | Payer: Self-pay | Source: Ambulatory Visit | Attending: Obstetrics and Gynecology

## 2016-11-02 ENCOUNTER — Encounter (HOSPITAL_COMMUNITY): Payer: Self-pay | Admitting: *Deleted

## 2016-11-02 DIAGNOSIS — Z98891 History of uterine scar from previous surgery: Secondary | ICD-10-CM

## 2016-11-02 LAB — COMPREHENSIVE METABOLIC PANEL
ALBUMIN: 2.3 g/dL — AB (ref 3.5–5.0)
ALK PHOS: 61 U/L (ref 38–126)
ALT: 22 U/L (ref 14–54)
AST: 30 U/L (ref 15–41)
Anion gap: 11 (ref 5–15)
BILIRUBIN TOTAL: 0.5 mg/dL (ref 0.3–1.2)
BUN: 23 mg/dL — AB (ref 6–20)
CALCIUM: 8.9 mg/dL (ref 8.9–10.3)
CO2: 17 mmol/L — ABNORMAL LOW (ref 22–32)
CREATININE: 0.82 mg/dL (ref 0.44–1.00)
Chloride: 107 mmol/L (ref 101–111)
GFR calc Af Amer: >60 mL/min (ref >60)
GLUCOSE: 112 mg/dL — AB (ref 65–99)
Potassium: 4.3 mmol/L (ref 3.5–5.1)
Sodium: 135 mmol/L (ref 135–145)
TOTAL PROTEIN: 5.1 g/dL — AB (ref 6.5–8.1)

## 2016-11-02 LAB — CBC
HEMATOCRIT: 32.4 % — AB (ref 36.0–46.0)
HEMOGLOBIN: 11.4 g/dL — AB (ref 12.0–15.0)
MCH: 32.7 pg (ref 26.0–34.0)
MCHC: 35.2 g/dL (ref 30.0–36.0)
MCV: 92.8 fL (ref 78.0–100.0)
Platelets: 99 10*3/uL — ABNORMAL LOW (ref 150–400)
RBC: 3.49 MIL/uL — ABNORMAL LOW (ref 3.87–5.11)
RDW: 12.8 % (ref 11.5–15.5)
WBC: 22.8 10*3/uL — AB (ref 4.0–10.5)

## 2016-11-02 SURGERY — Surgical Case
Anesthesia: Spinal

## 2016-11-02 MED ORDER — ONDANSETRON HCL 4 MG/2ML IJ SOLN
INTRAMUSCULAR | Status: DC | PRN
Start: 2016-11-02 — End: 2016-11-02
  Administered 2016-11-02: 4 mg via INTRAVENOUS

## 2016-11-02 MED ORDER — PHENYLEPHRINE 8 MG IN D5W 100 ML (0.08MG/ML) PREMIX OPTIME
INJECTION | INTRAVENOUS | Status: AC
  Filled 2016-11-02: qty 100

## 2016-11-02 MED ORDER — FENTANYL CITRATE (PF) 100 MCG/2ML IJ SOLN
INTRAMUSCULAR | Status: AC
  Filled 2016-11-02: qty 2

## 2016-11-02 MED ORDER — ONDANSETRON HCL 4 MG/2ML IJ SOLN: 4.0000 mg | Freq: Three times a day (TID) | INTRAMUSCULAR | Status: DC | PRN

## 2016-11-02 MED ORDER — FENTANYL CITRATE (PF) 100 MCG/2ML IJ SOLN
INTRAMUSCULAR | Status: DC | PRN
  Administered 2016-11-02: 12.5 ug via INTRATHECAL

## 2016-11-02 MED ORDER — MEPERIDINE HCL 25 MG/ML IJ SOLN: 6.2500 mg | INTRAMUSCULAR | Status: DC | PRN

## 2016-11-02 MED ORDER — DIPHENHYDRAMINE HCL 50 MG/ML IJ SOLN: 12.5000 mg | INTRAMUSCULAR | Status: DC | PRN

## 2016-11-02 MED ORDER — LACTATED RINGERS IV SOLN
INTRAVENOUS | Status: DC | PRN
  Administered 2016-11-02: 12:00:00 via INTRAVENOUS

## 2016-11-02 MED ORDER — ONDANSETRON HCL 4 MG/2ML IJ SOLN
INTRAMUSCULAR | Status: AC
  Filled 2016-11-02: qty 2

## 2016-11-02 MED ORDER — OXYTOCIN 10 UNIT/ML IJ SOLN
INTRAVENOUS | Status: DC | PRN
  Administered 2016-11-02: 40 [IU] via INTRAVENOUS

## 2016-11-02 MED ORDER — MORPHINE SULFATE (PF) 0.5 MG/ML IJ SOLN
INTRAMUSCULAR | Status: DC | PRN
  Administered 2016-11-02: .2 mg via INTRATHECAL

## 2016-11-02 MED ORDER — NALOXONE HCL 2 MG/2ML IJ SOSY
1.0000 ug/kg/h | PREFILLED_SYRINGE | INTRAMUSCULAR | Status: DC | PRN
  Filled 2016-11-02: qty 2

## 2016-11-02 MED ORDER — OXYCODONE HCL 5 MG PO TABS
10.0000 mg | ORAL_TABLET | ORAL | Status: DC | PRN
Start: 2016-11-02 — End: 2016-11-05
  Administered 2016-11-03 – 2016-11-04 (×4): 10 mg via ORAL
  Filled 2016-11-02 (×4): qty 2

## 2016-11-02 MED ORDER — HYDRALAZINE HCL 20 MG/ML IJ SOLN: 10.0000 mg | Freq: Once | INTRAMUSCULAR | Status: DC | PRN

## 2016-11-02 MED ORDER — MENTHOL 3 MG MT LOZG: 1.0000 | LOZENGE | OROMUCOSAL | Status: DC | PRN

## 2016-11-02 MED ORDER — BUPIVACAINE IN DEXTROSE 0.75-8.25 % IT SOLN
INTRATHECAL | Status: AC
  Filled 2016-11-02: qty 2

## 2016-11-02 MED ORDER — SENNOSIDES-DOCUSATE SODIUM 8.6-50 MG PO TABS
2.0000 | ORAL_TABLET | ORAL | Status: DC
  Administered 2016-11-02 – 2016-11-03 (×2): 2 via ORAL
  Filled 2016-11-02 (×2): qty 2

## 2016-11-02 MED ORDER — NALBUPHINE HCL 10 MG/ML IJ SOLN: 5.0000 mg | INTRAMUSCULAR | Status: DC | PRN

## 2016-11-02 MED ORDER — LABETALOL HCL 5 MG/ML IV SOLN: 20.0000 mg | INTRAVENOUS | Status: DC | PRN

## 2016-11-02 MED ORDER — OXYCODONE HCL 5 MG PO TABS
5.0000 mg | ORAL_TABLET | ORAL | Status: DC | PRN
  Administered 2016-11-04: 5 mg via ORAL
  Filled 2016-11-02: qty 1

## 2016-11-02 MED ORDER — ZOLPIDEM TARTRATE 5 MG PO TABS: 5.0000 mg | ORAL_TABLET | Freq: Every evening | ORAL | Status: DC | PRN

## 2016-11-02 MED ORDER — OXYTOCIN 40 UNITS IN LACTATED RINGERS INFUSION - SIMPLE MED: 2.5000 [IU]/h | INTRAVENOUS | Status: AC

## 2016-11-02 MED ORDER — SCOPOLAMINE 1 MG/3DAYS TD PT72
1.0000 | MEDICATED_PATCH | Freq: Once | TRANSDERMAL | Status: DC
  Filled 2016-11-02: qty 1

## 2016-11-02 MED ORDER — MAGNESIUM SULFATE 40 G IN LACTATED RINGERS - SIMPLE
2.0000 g/h | INTRAVENOUS | Status: DC
  Administered 2016-11-03: 2 g/h via INTRAVENOUS
  Filled 2016-11-02: qty 500
  Filled 2016-11-02: qty 40

## 2016-11-02 MED ORDER — PRENATAL MULTIVITAMIN CH
1.0000 | ORAL_TABLET | Freq: Every day | ORAL | Status: DC
  Administered 2016-11-03 – 2016-11-04 (×2): 1 via ORAL
  Filled 2016-11-02 (×2): qty 1

## 2016-11-02 MED ORDER — NALOXONE HCL 0.4 MG/ML IJ SOLN
0.4000 mg | INTRAMUSCULAR | Status: DC | PRN
Start: 2016-11-02 — End: 2016-11-05

## 2016-11-02 MED ORDER — SODIUM CHLORIDE 0.9% FLUSH: 3.0000 mL | INTRAVENOUS | Status: DC | PRN

## 2016-11-02 MED ORDER — COCONUT OIL OIL: 1.0000 "application " | TOPICAL_OIL | Status: DC | PRN

## 2016-11-02 MED ORDER — DIPHENHYDRAMINE HCL 25 MG PO CAPS: 25.0000 mg | ORAL_CAPSULE | Freq: Four times a day (QID) | ORAL | Status: DC | PRN

## 2016-11-02 MED ORDER — NALBUPHINE HCL 10 MG/ML IJ SOLN: 5.0000 mg | Freq: Once | INTRAMUSCULAR | Status: DC | PRN

## 2016-11-02 MED ORDER — OXYTOCIN 10 UNIT/ML IJ SOLN
INTRAMUSCULAR | Status: AC
  Filled 2016-11-02: qty 4

## 2016-11-02 MED ORDER — DIPHENHYDRAMINE HCL 25 MG PO CAPS
25.0000 mg | ORAL_CAPSULE | ORAL | Status: DC | PRN
  Administered 2016-11-02: 25 mg via ORAL
  Filled 2016-11-02: qty 1

## 2016-11-02 MED ORDER — MAGNESIUM SULFATE BOLUS VIA INFUSION
6.0000 g | Freq: Once | INTRAVENOUS | Status: AC
  Administered 2016-11-02 (×2): 6 g via INTRAVENOUS
  Filled 2016-11-02: qty 500

## 2016-11-02 MED ORDER — NALBUPHINE HCL 10 MG/ML IJ SOLN
5.0000 mg | INTRAMUSCULAR | Status: DC | PRN
Start: 2016-11-02 — End: 2016-11-05

## 2016-11-02 MED ORDER — WITCH HAZEL-GLYCERIN EX PADS: 1.0000 "application " | MEDICATED_PAD | CUTANEOUS | Status: DC | PRN

## 2016-11-02 MED ORDER — CEFAZOLIN SODIUM-DEXTROSE 2-4 GM/100ML-% IV SOLN
2.0000 g | Freq: Once | INTRAVENOUS | Status: AC
  Administered 2016-11-02: 2 g via INTRAVENOUS
  Filled 2016-11-02: qty 100

## 2016-11-02 MED ORDER — SIMETHICONE 80 MG PO CHEW
80.0000 mg | CHEWABLE_TABLET | Freq: Three times a day (TID) | ORAL | Status: DC
  Administered 2016-11-02 – 2016-11-05 (×7): 80 mg via ORAL
  Filled 2016-11-02 (×7): qty 1

## 2016-11-02 MED ORDER — LACTATED RINGERS IV SOLN: INTRAVENOUS | Status: DC

## 2016-11-02 MED ORDER — LIDOCAINE HCL (PF) 1 % IJ SOLN
INTRAMUSCULAR | Status: AC
  Filled 2016-11-02: qty 5

## 2016-11-02 MED ORDER — ACETAMINOPHEN 325 MG PO TABS
650.0000 mg | ORAL_TABLET | ORAL | Status: DC | PRN
  Administered 2016-11-02 – 2016-11-05 (×4): 650 mg via ORAL
  Filled 2016-11-02 (×4): qty 2

## 2016-11-02 MED ORDER — SOD CITRATE-CITRIC ACID 500-334 MG/5ML PO SOLN
ORAL | Status: AC
Start: 2016-11-02 — End: 2016-11-02
  Administered 2016-11-02: 30 mL
  Filled 2016-11-02: qty 15

## 2016-11-02 MED ORDER — MORPHINE SULFATE (PF) 0.5 MG/ML IJ SOLN
INTRAMUSCULAR | Status: AC
  Filled 2016-11-02: qty 10

## 2016-11-02 MED ORDER — PHENYLEPHRINE 8 MG IN D5W 100 ML (0.08MG/ML) PREMIX OPTIME
INJECTION | INTRAVENOUS | Status: DC | PRN
Start: 2016-11-02 — End: 2016-11-02
  Administered 2016-11-02: 60 ug/min via INTRAVENOUS

## 2016-11-02 MED ORDER — LACTATED RINGERS IV SOLN
INTRAVENOUS | Status: DC
  Administered 2016-11-02: 50 mL/h via INTRAVENOUS

## 2016-11-02 MED ORDER — ACETAMINOPHEN 160 MG/5ML PO SOLN
960.0000 mg | Freq: Once | ORAL | Status: AC
  Administered 2016-11-02: 960 mg via ORAL
  Filled 2016-11-02: qty 40.6

## 2016-11-02 MED ORDER — TETANUS-DIPHTH-ACELL PERTUSSIS 5-2.5-18.5 LF-MCG/0.5 IM SUSP
0.5000 mL | Freq: Once | INTRAMUSCULAR | Status: AC
  Administered 2016-11-03: 0.5 mL via INTRAMUSCULAR
  Filled 2016-11-02: qty 0.5

## 2016-11-02 MED ORDER — SIMETHICONE 80 MG PO CHEW
80.0000 mg | CHEWABLE_TABLET | ORAL | Status: DC
  Administered 2016-11-02 – 2016-11-03 (×2): 80 mg via ORAL
  Filled 2016-11-02 (×2): qty 1

## 2016-11-02 MED ORDER — DIBUCAINE 1 % RE OINT: 1.0000 "application " | TOPICAL_OINTMENT | RECTAL | Status: DC | PRN

## 2016-11-02 MED ORDER — SIMETHICONE 80 MG PO CHEW: 80.0000 mg | CHEWABLE_TABLET | ORAL | Status: DC | PRN

## 2016-11-02 SURGICAL SUPPLY — 37 items
BENZOIN TINCTURE PRP APPL 2/3 (GAUZE/BANDAGES/DRESSINGS) ×2 IMPLANT
CHLORAPREP W/TINT 26ML (MISCELLANEOUS) ×2 IMPLANT
CLAMP CORD UMBIL (MISCELLANEOUS) IMPLANT
CLOSURE STERI STRIP 1/2 X4 (GAUZE/BANDAGES/DRESSINGS) ×2 IMPLANT
CLOTH BEACON ORANGE TIMEOUT ST (SAFETY) ×2 IMPLANT
CONTAINER PREFILL 10% NBF 15ML (MISCELLANEOUS) ×4 IMPLANT
DRSG OPSITE POSTOP 4X10 (GAUZE/BANDAGES/DRESSINGS) ×2 IMPLANT
ELECT REM PT RETURN 9FT ADLT (ELECTROSURGICAL) ×2
ELECTRODE REM PT RTRN 9FT ADLT (ELECTROSURGICAL) ×1 IMPLANT
EXTRACTOR VACUUM KIWI (MISCELLANEOUS) IMPLANT
GLOVE BIO SURGEON STRL SZ 6.5 (GLOVE) ×2 IMPLANT
GLOVE BIOGEL PI IND STRL 7.0 (GLOVE) ×1 IMPLANT
GLOVE BIOGEL PI INDICATOR 7.0 (GLOVE) ×1
GOWN STRL REUS W/TWL LRG LVL3 (GOWN DISPOSABLE) ×4 IMPLANT
KIT ABG SYR 3ML LUER SLIP (SYRINGE) ×2 IMPLANT
NEEDLE HYPO 25X5/8 SAFETYGLIDE (NEEDLE) ×2 IMPLANT
NS IRRIG 1000ML POUR BTL (IV SOLUTION) ×2 IMPLANT
PACK C SECTION WH (CUSTOM PROCEDURE TRAY) ×2 IMPLANT
PAD OB MATERNITY 4.3X12.25 (PERSONAL CARE ITEMS) ×2 IMPLANT
PENCIL SMOKE EVAC W/HOLSTER (ELECTROSURGICAL) ×2 IMPLANT
RTRCTR C-SECT PINK 25CM LRG (MISCELLANEOUS) ×2 IMPLANT
STRIP CLOSURE SKIN 1/2X4 (GAUZE/BANDAGES/DRESSINGS) IMPLANT
SUT CHROMIC 1 CTX 36 (SUTURE) ×4 IMPLANT
SUT PLAIN 0 NONE (SUTURE) IMPLANT
SUT PLAIN 2 0 XLH (SUTURE) ×2 IMPLANT
SUT VIC AB 0 CT1 27 (SUTURE) ×2
SUT VIC AB 0 CT1 27XBRD ANBCTR (SUTURE) ×2 IMPLANT
SUT VIC AB 2-0 CT1 (SUTURE) ×4 IMPLANT
SUT VIC AB 2-0 CT1 27 (SUTURE) ×1
SUT VIC AB 2-0 CT1 TAPERPNT 27 (SUTURE) ×1 IMPLANT
SUT VIC AB 3-0 CT1 27 (SUTURE)
SUT VIC AB 3-0 CT1 TAPERPNT 27 (SUTURE) IMPLANT
SUT VIC AB 3-0 SH 27 (SUTURE) ×1
SUT VIC AB 3-0 SH 27X BRD (SUTURE) ×1 IMPLANT
SUT VIC AB 4-0 KS 27 (SUTURE) ×2 IMPLANT
TOWEL OR 17X24 6PK STRL BLUE (TOWEL DISPOSABLE) ×2 IMPLANT
TRAY FOLEY BAG SILVER LF 14FR (SET/KITS/TRAYS/PACK) ×2 IMPLANT

## 2016-11-02 NOTE — Anesthesia Preprocedure Evaluation (Signed)
Anesthesia Evaluation  Patient identified by MRN, date of birth, ID band Patient awake    Reviewed: Allergy & Precautions, H&P , Patient's Chart, lab work & pertinent test results, reviewed documented beta blocker date and time   Airway Mallampati: II  TM Distance: >3 FB Neck ROM: full    Dental no notable dental hx.    Pulmonary    Pulmonary exam normal breath sounds clear to auscultation       Cardiovascular hypertension,  Rhythm:regular Rate:Normal     Neuro/Psych    GI/Hepatic   Endo/Other    Renal/GU      Musculoskeletal   Abdominal   Peds  Hematology   Anesthesia Other Findings   Reproductive/Obstetrics                             Anesthesia Physical Anesthesia Plan  ASA: III  Anesthesia Plan: Spinal   Post-op Pain Management:    Induction:   Airway Management Planned:   Additional Equipment:   Intra-op Plan:   Post-operative Plan:   Informed Consent: I have reviewed the patients History and Physical, chart, labs and discussed the procedure including the risks, benefits and alternatives for the proposed anesthesia with the patient or authorized representative who has indicated his/her understanding and acceptance.   Dental Advisory Given  Plan Discussed with: CRNA and Surgeon  Anesthesia Plan Comments: (  )        Anesthesia Quick Evaluation

## 2016-11-02 NOTE — Anesthesia Procedure Notes (Addendum)
Spinal  Patient location during procedure: OR Staffing Anesthesiologist: Cristela BlueJACKSON, Drema Eddington Preanesthetic Checklist Completed: patient identified, site marked, surgical consent, pre-op evaluation, timeout performed, IV checked, risks and benefits discussed and monitors and equipment checked Spinal Block Patient position: sitting Prep: DuraPrep Patient monitoring: heart rate, cardiac monitor, continuous pulse ox and blood pressure Approach: midline Location: L3-4 Injection technique: single-shot Needle Needle type: Sprotte  Needle gauge: 24 G Needle length: 9 cm Assessment Sensory level: T4 Additional Notes Spinal Dosage in OR  .75% Bupivicaine ml       1.3     [id number]   mcg        200    Fentanyl mcg           12.5

## 2016-11-02 NOTE — Transfer of Care (Signed)
Immediate Anesthesia Transfer of Care Note  Patient: Earnest Conroyshley M Keziah  Procedure(s) Performed: Procedure(s): CESAREAN SECTION (N/A)  Patient Location: PACU  Anesthesia Type:Spinal  Level of Consciousness: awake, alert  and oriented  Airway & Oxygen Therapy: Patient Spontanous Breathing  Post-op Assessment: Report given to RN and Post -op Vital signs reviewed and stable  Post vital signs: Reviewed and stable  Last Vitals:  Vitals:   11/02/16 1031 11/02/16 1242  BP: (!) 154/83 135/75  Pulse: 100 87  Resp:  20  Temp:      Last Pain:  Vitals:   11/02/16 1014  TempSrc:   PainSc: 0-No pain      Patients Stated Pain Goal: 3 (11/02/16 1014)  Complications: No apparent anesthesia complications

## 2016-11-02 NOTE — Op Note (Signed)
Operative Note    Preoperative Diagnosis Preterm pregnancy at 28 3/7 weeks Severe preeclampsia with thrombocytopenia Breech presentation Desires permanent sterility  Postoperative Diagnosis same  Procedure Primary transverse c-section with 2-layer closure of uterus and bilateral distal salpingectomies  Surgeon Huel Cote, MD  Anesthesia Spinal  Fluids: EBL UOP clear IVF  Findings A viable female infant in the footling breech presentation.  Apgars 5,7.  Weight 2#6oz Normal uterus tubes and ovaries.  Specimen Placenta and distal tube segments  Procedure Note  Patient was taken to the operating room where spinal anesthesia was obtained and found to be adequate by Allis clamp test. She was prepped and draped in the normal sterile fashion in the dorsal supine position with a leftward tilt. An appropriate time out was performed. A Pfannenstiel skin incision was then made with the scalpel and carried through to the underlying layer of fascia by sharp dissection and Bovie cautery. The fascia was nicked in the midline and the incision was extended laterally with Mayo scissors. The inferior aspect of the incision was grasped Coker clamps and dissected off the underlying rectus muscles. In a similar fashion the superior aspect was dissected off the rectus muscles. Rectus muscles were separated in the midline and the peritoneal cavity entered bluntly. The peritoneal incision was then extended both superiorly and inferiorly with careful attention to avoid both bowel and bladder. The Alexis self-retaining wound retractor was then placed within the incision and the lower uterine segment exposed.  The lower uterine segment was a bit narrow, but felt adequate for a transverse incision.   The bladder flap was developed with Metzenbaum scissors and pushed away from the lower uterine segment. The lower uterine segment was then incised in a transverse fashion and the cavity  itself entered bluntly. The incision was extended bluntly. The infant's feet were then grasped and gentle traction delivered the baby down to the level of the scapula.  The arms were reduced over the chest and the head delivered flexed.  The cord clamped and cut and the infant was handed off to the waiting neonatology team. . The placenta was then manually delivered from the uterus and was noted to be a bit adherent.  The uterus cleared of all clots and debris with moist lap sponge, with no obvious placental tissue remaining. The uterine incision was then repaired in 2 layers the first layer was a running locked layer 1-0 chromic and the second an imbricating layer of the same suture.  Additional figure-of eight sutures were placed at the right angle for hemostasis.   The tubes and ovaries were inspected and the gutters cleared of all clots and debris.  The fallopian tubes were then identified, grasped with a babcock clamp and traced to their fimbriated end.  The distal 1/4 of each tube was then clamped in a kelly clamp. Amputated and the pedicle secured with 2 suture ligatures of 2-0 vicryl.  THe pedicles were hemostatic bilaterally.  The uterine incision was inspected and found to be hemostatic. All instruments and sponges as well as the Alexis retractor were then removed from the abdomen. The rectus muscles and peritoneum were then reapproximated with several interrupted mattress sutures of 2-0 Vicryl. The fascia was then closed with 0 Vicryl in a running fashion. Subcutaneous tissue was reapproximated with 3-0 plain in a running fashion. The skin was closed with a subcuticular stitch of 4-0 Vicryl on a Keith needle and then reinforced with benzoin and Steri-Strips. At the conclusion of  the procedure all instruments and sponge counts were correct. Patient was taken to the recovery room in good condition while her baby was taken to the NICU after delivery.

## 2016-11-02 NOTE — Anesthesia Postprocedure Evaluation (Signed)
Anesthesia Post Note  Patient: Crystal Armstrong  Procedure(s) Performed: Procedure(s) (LRB): CESAREAN SECTION (N/A)  Patient location during evaluation: [facility] Anesthesia Type: Spinal Level of consciousness: awake Pain management: pain level controlled Vital Signs Assessment: post-procedure vital signs reviewed and stable Respiratory status: spontaneous breathing Cardiovascular status: stable Postop Assessment: no headache, no backache, spinal receding, patient able to bend at knees, no signs of nausea or vomiting and adequate PO intake Anesthetic complications: no        Last Vitals:  Vitals:   11/02/16 1337 11/02/16 1450  BP:  135/84  Pulse: 87 93  Resp: 17 16  Temp:  36.6 C    Last Pain:  Vitals:   11/02/16 1450  TempSrc: Axillary  PainSc: 5    Pain Goal: Patients Stated Pain Goal: 3 (11/02/16 1450)               Keyanni Whittinghill

## 2016-11-02 NOTE — Progress Notes (Addendum)
Patient ID: Crystal Armstrong, female   DOB: 1986/06/08, 31 y.o.   MRN: [id number] Reviewed patient labs this am and fetal heart rate tracing.  Platelets stable at 99K but likely a betamethasone effect.  FHR tracing baseline at 145-150 and still has variability but having some variable decels spontaneously and other subtle decelerations as well.    D/w Dr. Katherina Rightenny of MFM and he agrees with delivery this AM now that has steroids on board.  Hopefully platelets high enough anesthesia can do spinal instead of general anesthetic.  Will begin magnesium for CP prophylaxis and preeclampsia.  Notified anesthesia and OR.  Will proceed about 10-11 am time frame when pt NPO x 6-7 hours as long as tracing remains stable.   Reviewed risks and benefits of c-section with patient in detail.  Discussed risks of bleeding, infection and possible damage to bowel and bladder.  We discussed that it may be necessary to perform a vertical or T-incision on the uterus to effect delivery and she states she would like permanent sterilization anyway.  We discussed that in the event this baby did not do well or survive, would she desire another pregnancy and she states adamantly she would not.  We discussed risk of failure of 1/100 of pregnancy after a tubal and she would like to proceed.  We also discussed increased risk of ectopic should she become pregnant.

## 2016-11-02 NOTE — Progress Notes (Signed)
Pt returned from NICU after a visit with her baby. Pt was accompanied by her husband and the Press photographerCharge Nurse. Pt settled comfortable in bed, no concerns voiced.

## 2016-11-02 NOTE — Transfer of Care (Incomplete)
Immediate Anesthesia Transfer of Care Note  Patient: Crystal Armstrong  Procedure(s) Performed: Procedure(s): CESAREAN SECTION (N/A)  Patient Location: {PLACES; ANE POST:19477::"PACU"}  Anesthesia Type:{PROCEDURES; ANE POST ANESTHESIA TYPE:19480}  Level of Consciousness: {FINDINGS; ANE POST LEVEL OF CONSCIOUSNESS:19484}  Airway & Oxygen Therapy: {Exam; oxygen device:30095}  Post-op Assessment: {ASSESSMENT;POST-OP WUJWJX:91478}REPORT:18741}  Post vital signs: {DESC; ANE POST GNFAOZ:30865}VITALS:19483}  Last Vitals:  Vitals:   11/02/16 1242 11/02/16 1245  BP: 135/75 134/73  Pulse: 87 87  Resp: 20 11  Temp: 36.5 C     Last Pain:  Vitals:   11/02/16 1242  TempSrc: Oral  PainSc:       Patients Stated Pain Goal: 3 (11/02/16 1014)  Complications: {FINDINGS; ANE POST COMPLICATIONS:19485}

## 2016-11-03 LAB — COMPREHENSIVE METABOLIC PANEL
ALT: 25 U/L (ref 14–54)
AST: 43 U/L — AB (ref 15–41)
Albumin: 2.2 g/dL — ABNORMAL LOW (ref 3.5–5.0)
Alkaline Phosphatase: 61 U/L (ref 38–126)
Anion gap: 8 (ref 5–15)
BUN: 18 mg/dL (ref 6–20)
CHLORIDE: 105 mmol/L (ref 101–111)
CO2: 22 mmol/L (ref 22–32)
CREATININE: 0.76 mg/dL (ref 0.44–1.00)
Calcium: 7 mg/dL — ABNORMAL LOW (ref 8.9–10.3)
GFR calc Af Amer: >60 mL/min (ref >60)
GFR calc non Af Amer: >60 mL/min (ref >60)
Glucose, Bld: 85 mg/dL (ref 65–99)
Potassium: 4.6 mmol/L (ref 3.5–5.1)
SODIUM: 135 mmol/L (ref 135–145)
Total Bilirubin: 0.4 mg/dL (ref 0.3–1.2)
Total Protein: 5.3 g/dL — ABNORMAL LOW (ref 6.5–8.1)

## 2016-11-03 LAB — CBC
HCT: 31.2 % — ABNORMAL LOW (ref 36.0–46.0)
Hemoglobin: 10.8 g/dL — ABNORMAL LOW (ref 12.0–15.0)
MCH: 32.7 pg (ref 26.0–34.0)
MCHC: 34.6 g/dL (ref 30.0–36.0)
MCV: 94.5 fL (ref 78.0–100.0)
PLATELETS: 101 10*3/uL — AB (ref 150–400)
RBC: 3.3 MIL/uL — AB (ref 3.87–5.11)
RDW: 12.7 % (ref 11.5–15.5)
WBC: 20.1 10*3/uL — AB (ref 4.0–10.5)

## 2016-11-03 MED ORDER — LABETALOL HCL 200 MG PO TABS
200.0000 mg | ORAL_TABLET | Freq: Two times a day (BID) | ORAL | Status: DC
  Administered 2016-11-03 – 2016-11-04 (×3): 200 mg via ORAL
  Filled 2016-11-03 (×3): qty 1

## 2016-11-03 NOTE — Progress Notes (Signed)
Pt to NICU with husband to visit infant.

## 2016-11-03 NOTE — Progress Notes (Signed)
Subjective: Postpartum Day #1: Cesarean Delivery Patient reports incisional pain and tolerating PO.    Objective: Vital signs in last 24 hours: Temp:  [97.6 F (36.4 C)-98.3 F (36.8 C)] 97.6 F (36.4 C) (05/21 0810) Pulse Rate:  [81-106] 81 (05/21 0810) Resp:  [11-28] 18 (05/21 0810) BP: (134-161)/(73-97) 161/87 (05/21 0810) SpO2:  [95 %-100 %] 98 % (05/21 0810) Weight:  [82.9 kg (182 lb 12 oz)] 82.9 kg (182 lb 12 oz) (05/21 0708)  Physical Exam:  General: alert Lochia: appropriate Uterine Fundus: firm Incision: dressing C/D/I DTR 1+  Recent Labs  11/02/16 0508 11/03/16 0528  HGB 11.4* 10.8*  HCT 32.4* 31.2*  Plt 101k, Cre 0.76, LFTs nl  Assessment/Plan: Status post Cesarean section. Doing well postoperatively. BP up a bit, will restart PO Labetalol 200 mg bid, diuresing well. Continue current care.  Will evaluate this afternoon to see if can stop magnesium  Crystal Armstrong 11/03/2016, 8:26 AM

## 2016-11-03 NOTE — Lactation Note (Signed)
This note was copied from a baby's chart. Lactation Consultation Note  Patient Name: Crystal Armstrong WUJWJ'XToday's Date: 11/03/2016 Reason for consult: Initial assessment;NICU baby;Infant < 6lbs   Initial consult with mom of 23 hour old NICU infant. Mom reports she attempted to BF her son and stopped when he was put on formula due to weight loss. Mom reports she has been pumping every 3 hours with a 5 hour stretch last night. Reviewed colostrum, supply and demand and milk coming to volume.  Mom with DEBP set up, she was shown to use Initiate setting when pumping. Enc her to follow with hand expression. Mom reports she was shown how to hand express. Providing milk for your Baby in NICU handout given, Reviewed pumping schedule and what to expect with milk coming to volume. Breast milk labeling and storage for NICU infant reviewed with parents. Dad to ask for BM labels from NICU. Yellow # stickers given.   Basin and soap given to parents to wash pump parts after pumping. BF Resources Handout and LC Brochure given, mom informed of IP/OP Services, BF Support Groups and LC phone #. Mom has a Medela Pump at home for use.    Maternal Data Formula Feeding for Exclusion: No Has patient been taught Hand Expression?: Yes (per mom) Does the patient have breastfeeding experience prior to this delivery?: Yes  Feeding    LATCH Score/Interventions                      Lactation Tools Discussed/Used WIC Program: No Pump Review: Setup, frequency, and cleaning;Milk Storage Initiated by:: Reviewed   Consult Status Consult Status: Follow-up Date: 11/04/16 Follow-up type: In-patient    Silas FloodSharon S Reyana Leisey 11/03/2016, 10:47 AM

## 2016-11-03 NOTE — Progress Notes (Signed)
Patient ID: Crystal Armstrong, female   DOB: [birth year], 31 y.o.   MRN: [id number] Mafnesium d/c'ed at lunch, feels ok, ambulating BP stable on Labetalol, voiding with foley out Continue routine care

## 2016-11-04 MED ORDER — LABETALOL HCL 200 MG PO TABS
200.0000 mg | ORAL_TABLET | Freq: Three times a day (TID) | ORAL | Status: DC
Start: 2016-11-04 — End: 2016-11-04

## 2016-11-04 MED ORDER — LABETALOL HCL 200 MG PO TABS
200.0000 mg | ORAL_TABLET | Freq: Two times a day (BID) | ORAL | Status: DC
  Administered 2016-11-04 – 2016-11-05 (×2): 200 mg via ORAL
  Filled 2016-11-04 (×2): qty 1

## 2016-11-04 MED ORDER — NIFEDIPINE ER OSMOTIC RELEASE 30 MG PO TB24
30.0000 mg | ORAL_TABLET | Freq: Once | ORAL | Status: AC
  Administered 2016-11-04: 30 mg via ORAL
  Filled 2016-11-04: qty 1

## 2016-11-04 MED ORDER — NIFEDIPINE ER OSMOTIC RELEASE 30 MG PO TB24: 30.0000 mg | ORAL_TABLET | Freq: Every day | ORAL | Status: DC

## 2016-11-04 MED ORDER — NIFEDIPINE ER OSMOTIC RELEASE 30 MG PO TB24
30.0000 mg | ORAL_TABLET | Freq: Every day | ORAL | Status: DC
  Administered 2016-11-04 – 2016-11-05 (×2): 30 mg via ORAL
  Filled 2016-11-04 (×2): qty 1

## 2016-11-04 NOTE — Progress Notes (Addendum)
Patient ID: Crystal Armstrong, female   DOB: 06-25-1985, 31 y.o.   MRN: [id number] BP still increasing despite am labetalol.Pt asymptomatic Pt had better BP on MgSO4 hence will keep labetalol at 200mg  BID ( 9am and 9pm)  and add procardia 30XL now ( 1300) Continue to monitor BP; assess for preeclampsia

## 2016-11-04 NOTE — Progress Notes (Addendum)
Patient ID: Crystal Armstrong, female   DOB: [birth year], 31 y.o.   MRN: [id number] Pt doing well. Denies headaches, blurry vision, CP or SOB. Ambulated to NICU this am with no issues. Has diffuse abdominal pain controlled with meds. Voiding well and passing flatus. Has no complaints VS 153/92 (145-153/80-92)      - received labetalol after am bp read ABD - soft, mild distention as expected, FF EXT - no homans, mild edema  A/P: POD32 s/p LTC/S for breech/ preE s/p MgSO4 pp         Will increase labetalol to TID         Continue to monitor BP          Routine pp/post op care

## 2016-11-04 NOTE — Progress Notes (Signed)
Patient ID: Crystal Armstrong, female   DOB: 01/20/1986, 31 y.o.   MRN: [id number] Pt in NICU. Denies headache, blurry vision.  Voiding well BP still elevated but may be trending down after procardia 30XL. Will put parameters SBP >165 and DBP >100 to notify OBGYN; will consider increasing procardia  Continue routine pp/post op care

## 2016-11-04 NOTE — Lactation Note (Signed)
This note was copied from a baby's chart. Lactation Consultation Note  Patient Name: Crystal Renella Cunasshley Elie WUJWJ'XToday's Date: 11/04/2016   Mom is pumping every 3 hours and obtaining drops of colostrum.  Reassured and encouraged to continue pumping and volume should be increasing over there next few days.  Encouraged to call with questions/assist.  Maternal Data    Feeding    LATCH Score/Interventions                      Lactation Tools Discussed/Used     Consult Status      Huston FoleyMOULDEN, Sarah-Jane Nazario S 11/04/2016, 12:02 PM

## 2016-11-05 MED ORDER — NIFEDIPINE ER 30 MG PO TB24: 30.0000 mg | ORAL_TABLET | Freq: Every day | ORAL | 2 refills | Status: DC

## 2016-11-05 MED ORDER — OXYCODONE HCL 5 MG PO TABS: ORAL_TABLET | ORAL | 0 refills | Status: DC

## 2016-11-05 MED ORDER — LABETALOL HCL 200 MG PO TABS: 200.0000 mg | ORAL_TABLET | Freq: Two times a day (BID) | ORAL | 2 refills | Status: DC

## 2016-11-05 MED ORDER — IBUPROFEN 800 MG PO TABS: 800.0000 mg | ORAL_TABLET | Freq: Three times a day (TID) | ORAL | 1 refills | Status: DC | PRN

## 2016-11-05 MED ORDER — PRENATE PIXIE 10-0.6-0.4-200 MG PO CAPS: 1.0000 | ORAL_CAPSULE | Freq: Every day | ORAL | 3 refills | Status: DC

## 2016-11-05 NOTE — Progress Notes (Addendum)
CLINICAL SOCIAL WORK MATERNAL/CHILD NOTE  Patient Details  Name: Crystal Armstrong MRN: [id number] Date of Birth: 11/02/2017  Date:  11/05/2016  Clinical Social Worker Initiating Note:  Hermila Millis, LCSW Date/ Time Initiated:  11/05/16/1033     Child's Name:  Crystal Armstrong   Legal Guardian:  Mother   Need for Interpreter:  None   Date of Referral:  11/05/16     Reason for Referral:  Other (Comment) (NICU admission/  anxiety and depression)   Referral Source:  NICU   Address:     Phone number:      Household Members:  Siblings, Minor Children, Parents   Natural Supports (not living in the home):  Extended Family, Friends   Professional Supports: None   Employment: Full-time   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  Consult for ongoing support during hospital duration as baby will have long stay in NICU delivered at 28 weeks.  Consult also for hx of anxiety   LCSW met with MOB prior to MOB's discharge from OB high risk unit.  MOB alone in room and welcoming of assessment and resources. Discussed with MOB role of SW while in NICU and provided education.  MOB very flat, but engaging in conversation.  She reports she is still overwhelmed with having a normal pregnancy up until 1 week ago, her blood pressure increasing and then having a baby.  Reports she has had moments of trying to process all the emotions she has experienced in the last week.   LCSW gave MOB time  to process and engaged in different techniques of grounding when she starts to feel out of control and other supports when visiting baby in NICU. LCSW provided education regarding NICU staff, rooming in, different members of the team, and resources if needed.  MOB very receptive and appreciative.  Discussed that baby did qualify for SSI due to low birth weight.  MOB signed request of access and given copy of admission summary in effort to apply for SSI once SSN has been received. Left education and information in room with MOB. Discussed other resources such as the support groups, lactation, and SW interventions if warranted in the next few weeks.  MOB reports she will be able to use her short term disability for some time off, then will go back to work and when baby ready to come home she will take her time off for bonding. MOB appeared to be very controlled and plans in place.  No other resources warranted at this time.  MIL will be picking patient up this morning and she will discharge home.  MOB reports that her six year old son is excited about being a big brother, but still working to address why baby has to remain in the hospital.  No other needs and LCSW will continue to follow while baby in NICU for psychosocial stressors or emotional support.    CSW Plan/Description:  Information/Referral to Community   Resources , Patient/Family Education , Psychosocial Support and Ongoing Assessment of Needs    Troyce Gieske N, LCSW 11/05/2016, 10:34 AM  

## 2016-11-05 NOTE — Lactation Note (Signed)
This note was copied from a baby's chart. Lactation Consultation Note  Patient Name: Crystal Armstrong Today's Date: 11/05/2016  Mom continues to pump every 3 hours followed by hand expression.  No milk obtained yet.  Mom has a DEBP for home use and knows to bring her pump pieces with her when coming to NICU.   Maternal Data    Feeding Feeding Type: Donor Breast Milk Length of feed: 30 min  LATCH Score/Interventions                      Lactation Tools Discussed/Used     Consult Status      Huston FoleyMOULDEN, Wil Slape S 11/05/2016, 10:41 AM

## 2016-11-05 NOTE — Progress Notes (Signed)
Pt given discharge instructions and prescriptions.  Questions answered. Pt states understanding, signed and given copy.

## 2016-11-05 NOTE — Progress Notes (Signed)
Subjective: Postpartum Day 3: Cesarean Delivery Patient reports incisional pain and tolerating PO.    Objective: Vital signs in last 24 hours: Temp:  [98.1 F (36.7 C)-99.6 F (37.6 C)] 98.1 F (36.7 C) (05/23 0831) Pulse Rate:  [88-110] 97 (05/23 0831) Resp:  [16-20] 18 (05/23 0831) BP: (141-171)/(78-91) 142/87 (05/23 0831) SpO2:  [94 %-97 %] 97 % (05/23 0417) Weight:  [77.6 kg (171 lb)] 77.6 kg (171 lb) (05/23 0433)  Physical Exam:  General: alert and no distress Lochia: appropriate Uterine Fundus: firm Incision: healing well DVT Evaluation: No evidence of DVT seen on physical exam.   Recent Labs  11/03/16 0528  HGB 10.8*  HCT 31.2*    Assessment/Plan: Status post Cesarean section. Doing well postoperatively.  Discharge home with standard precautions and return to clinic in 2 and 6 weeks.  BP better controlled Procardia 60/labetalol D/c with motrin, percocet and PNV  Crystal Armstrong 11/05/2016, 9:25 AM

## 2016-11-05 NOTE — Discharge Summary (Signed)
OB Discharge Summary     Patient Name: Crystal Armstrong DOB: 1985-11-06 MRN: [id number]  Date of admission: 10/31/2016 Delivering MD: Huel CoteICHARDSON, KATHY   Date of discharge: 11/05/2016  Admitting diagnosis: 28 wks preeclampsia told to come over bc of her labs Intrauterine pregnancy: 4161w3d     Secondary diagnosis:  Active Problems:   Chronic hypertension with superimposed preeclampsia   S/P primary low transverse C-section  Additional problems: N/A     Discharge diagnosis: Preterm Pregnancy Delivered and CHTN with superimposed preeclampsia                                                                                                Post partum procedures:postpartum tubal ligation  Augmentation: N/A  Complications: None  Hospital course:  Sceduled C/S   31 y.o. yo [id number] at 9961w3d was admitted to the hospital 10/31/2016 for scheduled cesarean section with the following indication:Malpresentation and worsening PreEclampsia.  Membrane Rupture Time/Date: 11:27 AM ,11/02/2016   Patient delivered a Viable infant.11/02/2016  Details of operation can be found in separate operative note.  Pateint had an uncomplicated postpartum course.  She is ambulating, tolerating a regular diet, passing flatus, and urinating well. Patient is discharged home in stable condition on  11/05/16         Physical exam  Vitals:   11/05/16 0018 11/05/16 0417 11/05/16 0433 11/05/16 0831  BP: (!) 157/80 (!) 141/78  (!) 142/87  Pulse: 91 96  97  Resp:    18  Temp:    98.1 F (36.7 C)  TempSrc:    Oral  SpO2:  97%    Weight:   77.6 kg (171 lb)   Height:       General: alert and no distress Lochia: appropriate Uterine Fundus: firm Incision: Healing well with no significant drainage DVT Evaluation: No evidence of DVT seen on physical exam. Labs: Lab Results  Component Value Date   WBC 20.1 (H) 11/03/2016   HGB 10.8 (L) 11/03/2016   HCT 31.2 (L) 11/03/2016   MCV 94.5 11/03/2016   PLT 101 (L)  11/03/2016   CMP Latest Ref Rng & Units 11/03/2016  Glucose 65 - 99 mg/dL 85  BUN 6 - 20 mg/dL 18  Creatinine 1.470.44 - 8.291.00 mg/dL 5.620.76  Sodium 130135 - 865145 mmol/L 135  Potassium 3.5 - 5.1 mmol/L 4.6  Chloride 101 - 111 mmol/L 105  CO2 22 - 32 mmol/L 22  Calcium 8.9 - 10.3 mg/dL 7.0(L)  Total Protein 6.5 - 8.1 g/dL 5.3(L)  Total Bilirubin 0.3 - 1.2 mg/dL 0.4  Alkaline Phos 38 - 126 U/L 61  AST 15 - 41 U/L 43(H)  ALT 14 - 54 U/L 25    Discharge instruction: per After Visit Summary and "Baby and Me Booklet".  After visit meds:  Allergies as of 11/05/2016   No Known Allergies     Medication List    TAKE these medications   ibuprofen 800 MG tablet Commonly known as:  MOTRIN IB Take 1 tablet (800 mg total) by mouth every 8 (eight) hours as needed.   labetalol  200 MG tablet Commonly known as:  NORMODYNE Take 1 tablet (200 mg total) by mouth 2 (two) times daily.   NIFEdipine 30 MG 24 hr tablet Commonly known as:  PROCARDIA-XL/ADALAT CC Take 1 tablet (30 mg total) by mouth daily. Start taking on:  11/06/2016   oxyCODONE 5 MG immediate release tablet Commonly known as:  Oxy IR/ROXICODONE 1-2 tablets po q 6 hr prn severe pain   PRENATE PIXIE 10-0.6-0.4-200 MG Caps Take 1 capsule by mouth at bedtime.   ranitidine 150 MG tablet Commonly known as:  ZANTAC Take 150 mg by mouth 2 (two) times daily.       Diet: routine diet  Activity: Advance as tolerated. Pelvic rest for 6 weeks.   Outpatient follow up:2 and 6 weeks Follow up Appt:No future appointments. Follow up Visit:No Follow-up on file.  Postpartum contraception: Tubal Ligation  Newborn Data: Live born female  Birth Weight: 2 lb 4.3 oz (1030 g) APGAR: 5, 7  Baby Feeding: Breast Disposition:NICU   11/05/2016 Sherian Rein, MD

## 2016-11-13 ENCOUNTER — Ambulatory Visit: Payer: Self-pay

## 2016-11-13 NOTE — Lactation Note (Addendum)
This note was copied from a baby's chart. Lactation Consultation Note  Patient Name: Crystal Renella Cunasshley Peffley [id number]'KToday's Date: 11/13/2016 Reason for consult: Follow-up assessment;NICU baby  NICU baby 211 days old. Mom reports that the most she has been able to pump is 10 ml a few evenings, and she is using Fenugreek, eating lactation cookies and drinking Mother's Milk tea. Enc mom to discuss galactagogues with OB. Discussed mom's pumping schedule, and it sounds like she is pumping 6 or fewer times a day now. Discussed power-pumping before bed, and then pumping more often in the morning--2 hours apart. Enc mom to pump at least 8 times/24 hours. Enc pumping after STS with the baby and having pictures of the baby with her when she pumps at home. Enc mom not to watch her breasts while pumping too. Discussed with mom all the factors that can influence milk coming to volume, and that sometimes we do not know why a mom's milk does not increase in volume. Discussed the benefits to the baby of even small amounts of EBM. Enc mom to call for assistance as needed.   Maternal Data    Feeding Feeding Type: Donor Breast Milk Length of feed: 90 min  LATCH Score/Interventions                      Lactation Tools Discussed/Used     Consult Status Consult Status: PRN    Crystal HayJennifer D Loron Weimer 11/13/2016, 5:54 PM

## 2016-11-14 DIAGNOSIS — R918 Other nonspecific abnormal finding of lung field: Secondary | ICD-10-CM | POA: Diagnosis not present

## 2016-11-17 ENCOUNTER — Ambulatory Visit: Payer: Self-pay

## 2016-11-17 NOTE — Lactation Note (Signed)
This note was copied from a baby's chart. Lactation Consultation Note  Patient Name: Crystal Armstrong ZOXWR'UToday's Date: 11/17/2016 Reason for consult: Follow-up assessment;NICU baby  NICU baby 482 weeks old. Mom reports that she is pumping 8 times/24 hours, and collecting a total of about 60 ml of EBM/day. Discussed with mom that whatever EBM that she is able to collect is very beneficial for baby, and mom states that she is going to continue pumping.   Maternal Data    Feeding Feeding Type: Donor Breast Milk Length of feed: 90 min  LATCH Score/Interventions                      Lactation Tools Discussed/Used     Consult Status Consult Status: PRN    Sherlyn HayJennifer D Vernis Cabacungan 11/17/2016, 9:48 AM

## 2016-12-03 ENCOUNTER — Ambulatory Visit: Payer: Self-pay

## 2016-12-03 NOTE — Lactation Note (Signed)
This note was copied from a baby's chart. Lactation Consultation Note  Patient Name: [name] GMWNU'UToday's Date: 12/03/2016 Reason for consult: Follow-up assessment;NICU baby;Infant < 6lbs   Follow up with mom at infant's bedside. Mom reports it takes all day to get 2-4 oz of EBM. She is taking fenugreek and pumping 8x/day. RN in NICU suggested mom get Fenugreek liquid capsules vs powder. Mom is to get from [employer] and try that. Mom without questions/concerns/needs at this time.     Maternal Data    Feeding Feeding Type: Donor Breast Milk Length of feed: 120 min  LATCH Score/Interventions                      Lactation Tools Discussed/Used     Consult Status Consult Status: PRN Follow-up type: Call as needed    Ed BlalockSharon S Greenley Martone 12/03/2016, 2:28 PM

## 2016-12-08 ENCOUNTER — Ambulatory Visit: Payer: Self-pay

## 2016-12-08 NOTE — Lactation Note (Signed)
This note was copied from a baby's chart. Lactation Consultation Note  Patient Name: [name] EAVWU'JToday's Date: 12/08/2016  Pecola LeisureBaby is 435 weeks old.  Mom states she pumps 30 mls every 3 hours.  Mom recently put baby to breast twice and states baby latched.  Encouraged to continue pumping and latching when possible.  Supply may increase with baby at breast more.   Maternal Data    Feeding Feeding Type: Breast Milk with Formula added Length of feed: 90 min  LATCH Score/Interventions                      Lactation Tools Discussed/Used     Consult Status      Huston FoleyMOULDEN, Ghali Morissette S 12/08/2016, 2:51 PM

## 2016-12-26 NOTE — Addendum Note (Signed)
Addendum  created 12/26/16 1303 by Cristela BlueJackson, Taytum Scheck, MD   Sign clinical note

## 2016-12-26 NOTE — Anesthesia Postprocedure Evaluation (Signed)
Anesthesia Post Note  Patient: Crystal Armstrong  Procedure(s) Performed: Procedure(s) (LRB): CESAREAN SECTION (N/A)     Anesthesia Post Evaluation  Last Vitals:  Vitals:   11/05/16 0831 11/05/16 0935  BP: (!) 142/87 120/69  Pulse: 97 97  Resp: 18   Temp: 36.7 C     Last Pain:  Vitals:   11/05/16 0831  TempSrc: Oral  PainSc:                  Jiles GarterJACKSON,[name]

## 2017-01-14 ENCOUNTER — Other Ambulatory Visit: Payer: Self-pay

## 2017-01-14 MED ORDER — CITALOPRAM HYDROBROMIDE 10 MG PO TABS: 10.0000 mg | ORAL_TABLET | Freq: Every day | ORAL | 0 refills | Status: DC

## 2017-01-22 DIAGNOSIS — Z1389 Encounter for screening for other disorder: Secondary | ICD-10-CM | POA: Diagnosis not present

## 2017-01-22 DIAGNOSIS — Z3009 Encounter for other general counseling and advice on contraception: Secondary | ICD-10-CM | POA: Diagnosis not present

## 2017-02-10 ENCOUNTER — Ambulatory Visit (INDEPENDENT_AMBULATORY_CARE_PROVIDER_SITE_OTHER): Payer: BLUE CROSS/BLUE SHIELD | Admitting: Physician Assistant

## 2017-02-10 ENCOUNTER — Encounter: Payer: Self-pay | Admitting: Physician Assistant

## 2017-02-10 VITALS — BP 118/78 | HR 63 | Temp 97.9°F | Ht 62.0 in | Wt 140.0 lb

## 2017-02-10 DIAGNOSIS — Z79899 Other long term (current) drug therapy: Secondary | ICD-10-CM

## 2017-02-10 DIAGNOSIS — Z23 Encounter for immunization: Secondary | ICD-10-CM

## 2017-02-10 DIAGNOSIS — F411 Generalized anxiety disorder: Secondary | ICD-10-CM

## 2017-02-10 LAB — COMPLETE METABOLIC PANEL WITH GFR
ALT: 12 U/L (ref 6–29)
AST: 15 U/L (ref 10–30)
Albumin: 4.3 g/dL (ref 3.6–5.1)
Alkaline Phosphatase: 66 U/L (ref 33–115)
BUN: 7 mg/dL (ref 7–25)
CALCIUM: 9.1 mg/dL (ref 8.6–10.2)
CHLORIDE: 106 mmol/L (ref 98–110)
CO2: 25 mmol/L (ref 20–32)
CREATININE: 0.87 mg/dL (ref 0.50–1.10)
GFR, Est Non African American: 88 mL/min (ref >=60)
GLUCOSE: 85 mg/dL (ref 65–99)
Potassium: 4 mmol/L (ref 3.5–5.3)
Sodium: 138 mmol/L (ref 135–146)
TOTAL PROTEIN: 7 g/dL (ref 6.1–8.1)
Total Bilirubin: 0.7 mg/dL (ref 0.2–1.2)

## 2017-02-10 MED ORDER — CITALOPRAM HYDROBROMIDE 10 MG PO TABS: 10.0000 mg | ORAL_TABLET | Freq: Every day | ORAL | 3 refills | Status: DC

## 2017-02-10 NOTE — Progress Notes (Signed)
Call pt: labs normal. Look great.

## 2017-02-10 NOTE — Progress Notes (Signed)
   Subjective:    Patient ID: NORMAL KUZMICH, female    DOB: [birth year], 31 y.o.   MRN: [id number]  HPI Pt is a 31 yo female who presents to the clinic for GAD follow up and medication refill. She is doing great. She had a premature little girl almost 4 months ago but baby is home now and doing good. She started her celexa back after she decided to stop breast feedings. She is now on maternity leave. She needs refills.   .. Active Ambulatory Problems    Diagnosis Date Noted  . Migraine without aura and without status migrainosus, not intractable 11/16/2015  . SOB (shortness of breath) 11/16/2015  . Anxiety state 11/16/2015  . GAD (generalized anxiety disorder) 01/03/2016  . High triglycerides 01/04/2016  . Chronic hypertension with superimposed preeclampsia 10/31/2016  . S/P primary low transverse C-section 11/02/2016   Resolved Ambulatory Problems    Diagnosis Date Noted  . No Resolved Ambulatory Problems   Past Medical History:  Diagnosis Date  . Allergy   . Migraine   . Pregnancy induced hypertension 2011      Review of Systems  All other systems reviewed and are negative.      Objective:   Physical Exam  Constitutional: She is oriented to person, place, and time. She appears well-developed and well-nourished.  HENT:  Head: Normocephalic and atraumatic.  Cardiovascular: Normal rate, regular rhythm and normal heart sounds.   Pulmonary/Chest: Effort normal and breath sounds normal.  Neurological: She is alert and oriented to person, place, and time.  Psychiatric: She has a normal mood and affect. Her behavior is normal.          Assessment & Plan:  Marland KitchenMarland KitchenCristela was seen today for medication refill.  Diagnoses and all orders for this visit:  GAD (generalized anxiety disorder) -     citalopram (CELEXA) 10 MG tablet; Take 1 tablet (10 mg total) by mouth daily.  Medication management -     COMPLETE METABOLIC PANEL WITH GFR   2nd recheck of BP great.  cmp  ordered.   .. GAD 7 : Generalized Anxiety Score 02/10/2017 01/03/2016  Nervous, Anxious, on Edge 0 0  Control/stop worrying 1 1  Worry too much - different things 1 1  Trouble relaxing 0 1  Restless 0 0  Easily annoyed or irritable 0 0  Afraid - awful might happen 1 1  Total GAD 7 Score 3 4  Anxiety Difficulty Somewhat difficult -    Refilled celexa for 1 year.

## 2017-02-10 NOTE — Addendum Note (Signed)
Addended by: Thom Chimes on: 02/10/2017 10:08 AM   Modules accepted: Orders

## 2017-02-11 ENCOUNTER — Other Ambulatory Visit: Payer: Self-pay | Admitting: Physician Assistant

## 2017-04-01 ENCOUNTER — Other Ambulatory Visit: Payer: Self-pay | Admitting: *Deleted

## 2017-04-01 MED ORDER — ELETRIPTAN HYDROBROMIDE 20 MG PO TABS: 20.0000 mg | ORAL_TABLET | ORAL | 5 refills | Status: DC | PRN

## 2017-05-18 ENCOUNTER — Ambulatory Visit (INDEPENDENT_AMBULATORY_CARE_PROVIDER_SITE_OTHER): Payer: BLUE CROSS/BLUE SHIELD | Admitting: Physician Assistant

## 2017-05-18 ENCOUNTER — Encounter: Payer: Self-pay | Admitting: Physician Assistant

## 2017-05-18 VITALS — BP 118/70 | HR 74

## 2017-05-18 DIAGNOSIS — M545 Low back pain, unspecified: Secondary | ICD-10-CM

## 2017-05-18 DIAGNOSIS — M549 Dorsalgia, unspecified: Secondary | ICD-10-CM | POA: Diagnosis not present

## 2017-05-18 DIAGNOSIS — G43009 Migraine without aura, not intractable, without status migrainosus: Secondary | ICD-10-CM | POA: Diagnosis not present

## 2017-05-18 MED ORDER — TOPIRAMATE 50 MG PO TABS: ORAL_TABLET | ORAL | 1 refills | Status: DC

## 2017-05-18 NOTE — Patient Instructions (Signed)
Low Back Strain Rehab  Ask your health care provider which exercises are safe for you. Do exercises exactly as told by your health care provider and adjust them as directed. It is normal to feel mild stretching, pulling, tightness, or discomfort as you do these exercises, but you should stop right away if you feel sudden pain or your pain gets worse. Do not begin these exercises until told by your health care provider.  Stretching and range of motion exercises  These exercises warm up your muscles and joints and improve the movement and flexibility of your back. These exercises also help to relieve pain, numbness, and tingling.  Exercise A: Single knee to chest    1. Lie on your back on a firm surface with both legs straight.  2. Bend one of your knees. Use your hands to move your knee up toward your chest until you feel a gentle stretch in your lower back and buttock.  ? Hold your leg in this position by holding onto the front of your knee.  ? Keep your other leg as straight as possible.  3. Hold for __________ seconds.  4. Slowly return to the starting position.  5. Repeat with your other leg.  Repeat __________ times. Complete this exercise __________ times a day.  Exercise B: Prone extension on elbows    1. Lie on your abdomen on a firm surface.  2. Prop yourself up on your elbows.  3. Use your arms to help lift your chest up until you feel a gentle stretch in your abdomen and your lower back.  ? This will place some of your body weight on your elbows. If this is uncomfortable, try stacking pillows under your chest.  ? Your hips should stay down, against the surface that you are lying on. Keep your hip and back muscles relaxed.  4. Hold for __________ seconds.  5. Slowly relax your upper body and return to the starting position.  Repeat __________ times. Complete this exercise __________ times a day.  Strengthening exercises  These exercises build strength and endurance in your back. Endurance is the ability to  use your muscles for a long time, even after they get tired.  Exercise C: Pelvic tilt  1. Lie on your back on a firm surface. Bend your knees and keep your feet flat.  2. Tense your abdominal muscles. Tip your pelvis up toward the ceiling and flatten your lower back into the floor.  ? To help with this exercise, you may place a small towel under your lower back and try to push your back into the towel.  3. Hold for __________ seconds.  4. Let your muscles relax completely before you repeat this exercise.  Repeat __________ times. Complete this exercise __________ times a day.  Exercise D: Alternating arm and leg raises    1. Get on your hands and knees on a firm surface. If you are on a hard floor, you may want to use padding to cushion your knees, such as an exercise mat.  2. Line up your arms and legs. Your hands should be below your shoulders, and your knees should be below your hips.  3. Lift your left leg behind you. At the same time, raise your right arm and straighten it in front of you.  ? Do not lift your leg higher than your hip.  ? Do not lift your arm higher than your shoulder.  ? Keep your abdominal and back muscles tight.  ?   Keep your hips facing the ground.  ? Do not arch your back.  ? Keep your balance carefully, and do not hold your breath.  4. Hold for __________ seconds.  5. Slowly return to the starting position and repeat with your right leg and your left arm.  Repeat __________ times. Complete this exercise __________times a day.  Exercise J: Single leg lower with bent knees  1. Lie on your back on a firm surface.  2. Tense your abdominal muscles and lift your feet off the floor, one foot at a time, so your knees and hips are bent in an "L" shape (at about 90 degrees).  ? Your knees should be over your hips and your lower legs should be parallel to the floor.  3. Keeping your abdominal muscles tense and your knee bent, slowly lower one of your legs so your toe touches the ground.  4. Lift your  leg back up to return to the starting position.  ? Do not hold your breath.  ? Do not let your back arch. Keep your back flat against the ground.  5. Repeat with your other leg.  Repeat __________ times. Complete this exercise __________ times a day.  Posture and body mechanics    Body mechanics refers to the movements and positions of your body while you do your daily activities. Posture is part of body mechanics. Good posture and healthy body mechanics can help to relieve stress in your body's tissues and joints. Good posture means that your spine is in its natural S-curve position (your spine is neutral), your shoulders are pulled back slightly, and your head is not tipped forward. The following are general guidelines for applying improved posture and body mechanics to your everyday activities.  Standing     When standing, keep your spine neutral and your feet about hip-width apart. Keep a slight bend in your knees. Your ears, shoulders, and hips should line up.   When you do a task in which you stand in one place for a long time, place one foot up on a stable object that is 2-4 inches (5-10 cm) high, such as a footstool. This helps keep your spine neutral.  Sitting     When sitting, keep your spine neutral and keep your feet flat on the floor. Use a footrest, if necessary, and keep your thighs parallel to the floor. Avoid rounding your shoulders, and avoid tilting your head forward.   When working at a desk or a computer, keep your desk at a height where your hands are slightly lower than your elbows. Slide your chair under your desk so you are close enough to maintain good posture.   When working at a computer, place your monitor at a height where you are looking straight ahead and you do not have to tilt your head forward or downward to look at the screen.  Resting     When lying down and resting, avoid positions that are most painful for you.   If you have pain with activities such as sitting, bending,  stooping, or squatting (flexion-based activities), lie in a position in which your body does not bend very much. For example, avoid curling up on your side with your arms and knees near your chest (fetal position).   If you have pain with activities such as standing for a long time or reaching with your arms (extension-based activities), lie with your spine in a neutral position and bend your knees slightly. Try the   following positions:  ? Lying on your side with a pillow between your knees.  ? Lying on your back with a pillow under your knees.  Lifting     When lifting objects, keep your feet at least shoulder-width apart and tighten your abdominal muscles.   Bend your knees and hips and keep your spine neutral. It is important to lift using the strength of your legs, not your back. Do not lock your knees straight out.   Always ask for help to lift heavy or awkward objects.  This information is not intended to replace advice given to you by your health care provider. Make sure you discuss any questions you have with your health care provider.  Document Released: 06/02/2005 Document Revised: 02/07/2016 Document Reviewed: 03/14/2015  Elsevier Interactive Patient Education  2018 [employer]

## 2017-05-18 NOTE — Progress Notes (Signed)
Subjective:    Patient ID: Crystal Armstrong, female    DOB: 1985/12/23, 31 y.o.   MRN: [id number]  HPI  Pt is a 31 yo female with a history of migraines who presents to the clinic to discuss migraines.   Patient has a long history of migraines.  She has never tried a preventative.  She thought her migraines were related to her hormones.  She seems to have clusters of migraines around her ovulation and menstrual cycle.  She will birth control but she really did not notice any improvement.  Then she got pregnant.  They seem to improve while pregnancy.  She now is 6 months post pregnancy she feels like headaches are occurring more frequently.  1 month she went through all 10 Maxalt for rescue. At times she can wake up in the morning and start with migraine.  She did recently try about 4 weeks of a no caffeine diet.  That tremendously made her migraines worse.  Patient is also having some mid to low back pain.  She has noticed this over the past month.  She denies any known injury.  She is trying to be more active.  She denies any numbness or tingling in her legs or arms.  She is taking ibuprofen with some relief. She tends to wake up stiff and achy.   .. Active Ambulatory Problems    Diagnosis Date Noted  . Migraine without aura and without status migrainosus, not intractable 11/16/2015  . SOB (shortness of breath) 11/16/2015  . Anxiety state 11/16/2015  . GAD (generalized anxiety disorder) 01/03/2016  . High triglycerides 01/04/2016  . Chronic hypertension with superimposed preeclampsia 10/31/2016  . S/P primary low transverse C-section 11/02/2016   Resolved Ambulatory Problems    Diagnosis Date Noted  . No Resolved Ambulatory Problems   Past Medical History:  Diagnosis Date  . Allergy   . Migraine   . Pregnancy induced hypertension 2011     Review of Systems  All other systems reviewed and are negative.      Objective:   Physical Exam  Constitutional: She is oriented to  person, place, and time. She appears well-developed and well-nourished.  HENT:  Head: Normocephalic and atraumatic.  Eyes: Conjunctivae are normal.  Neck: Normal range of motion. Neck supple.  Cardiovascular: Normal rate, regular rhythm and normal heart sounds.  Pulmonary/Chest: Effort normal and breath sounds normal. She has no wheezes.  Musculoskeletal:  Normal ROM at waist.  No tenderness over spine to palpation.  Some Paraspinous tenderness and tightness over mid back and down into low back.    Lymphadenopathy:    She has no cervical adenopathy.  Neurological: She is alert and oriented to person, place, and time.  Psychiatric: She has a normal mood and affect. Her behavior is normal.          Assessment & Plan:  Marland Kitchen.Marland Kitchen.Diagnoses and all orders for this visit:  Migraine without aura and without status migrainosus, not intractable -     topiramate (TOPAMAX) 50 MG tablet; One half tab by mouth daily for a week, then one tab by mouth daily for one week then increase to 1 tablet daily twice.  Mid back pain -     DG Thoracic Spine W/Swimmers; Future -     DG Thoracic Spine W/Swimmers  Acute bilateral low back pain without sciatica -     DG Lumbar Spine Complete; Future -     DG Lumbar Spine Complete   Will  try topamax for migraines. Discussed taper to 50mg  bid.  Discussed side effects.  Follow up in 2 months.  Continue to use maxalt as needed.   Discussed back pain is likely muscular. Exercises and stretches given for mid and low back.  Xrays ordered for baseline since ongoing for last month.  Discussed ibuprofen as needed.  Consider heat and biofreeze.  Follow up in 2 months if not improving.

## 2017-05-19 ENCOUNTER — Encounter: Payer: Self-pay | Admitting: Physician Assistant

## 2017-05-19 DIAGNOSIS — M545 Low back pain, unspecified: Secondary | ICD-10-CM | POA: Insufficient documentation

## 2017-05-19 DIAGNOSIS — M549 Dorsalgia, unspecified: Secondary | ICD-10-CM | POA: Insufficient documentation

## 2017-06-03 IMAGING — US US MFM OB COMP +14 WKS
1 series · 13 of 28 positions shown · non-contrast
Comparison: none

[Series 1: us mfm ob comp +14 wks · 13 of 62 slices shown]
[im 3/62]
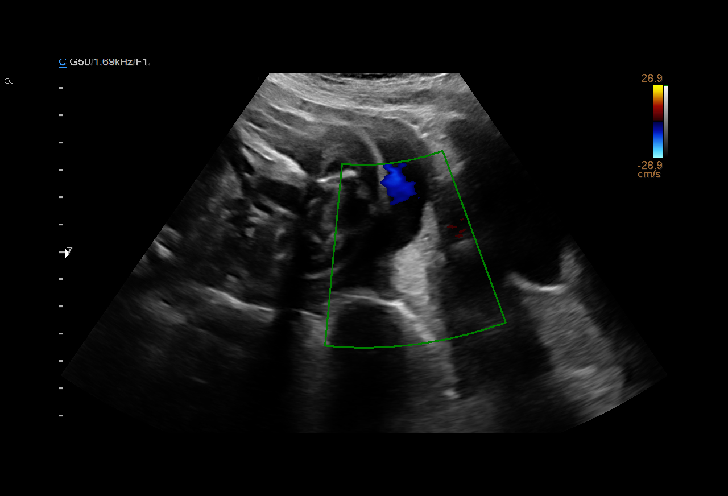
[im 7/62]
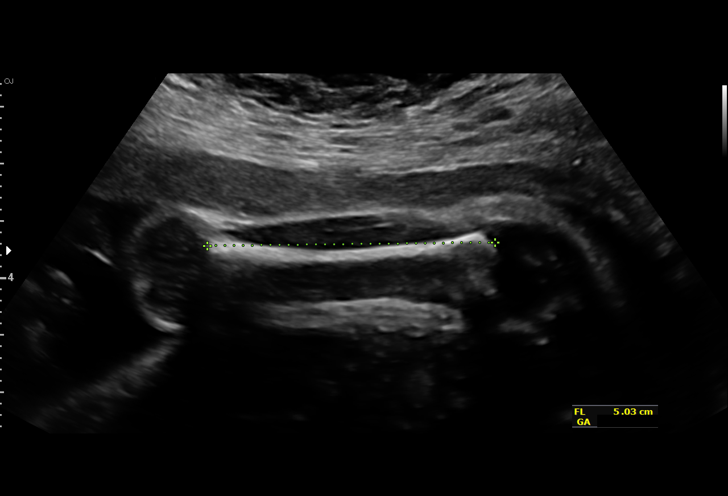
[im 12/62]
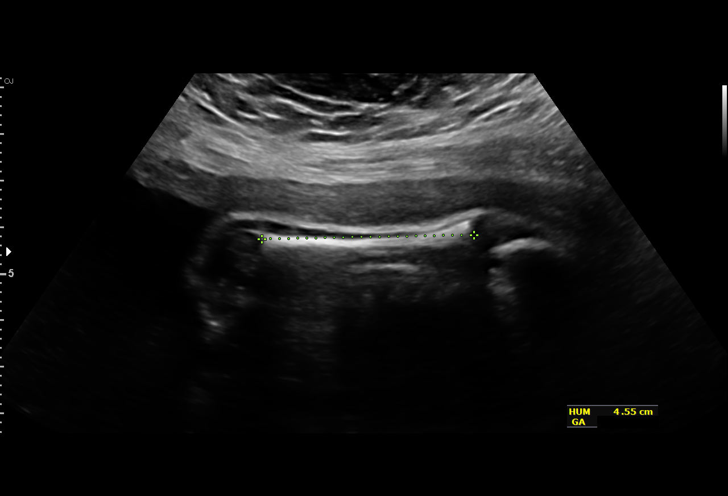
[im 16/62]
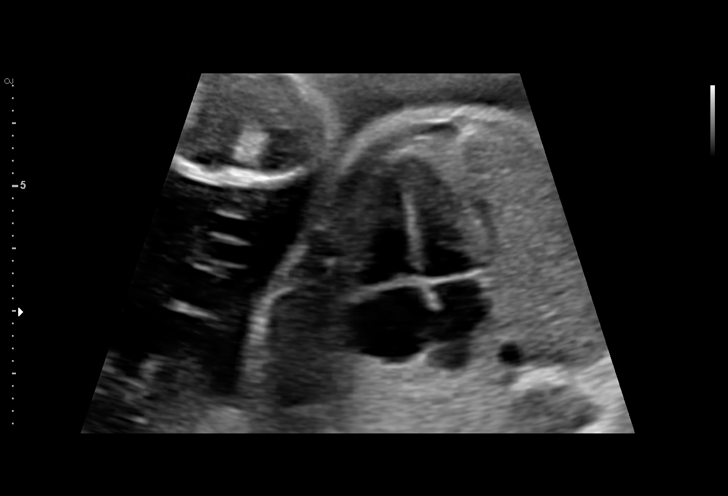
[im 21/62]
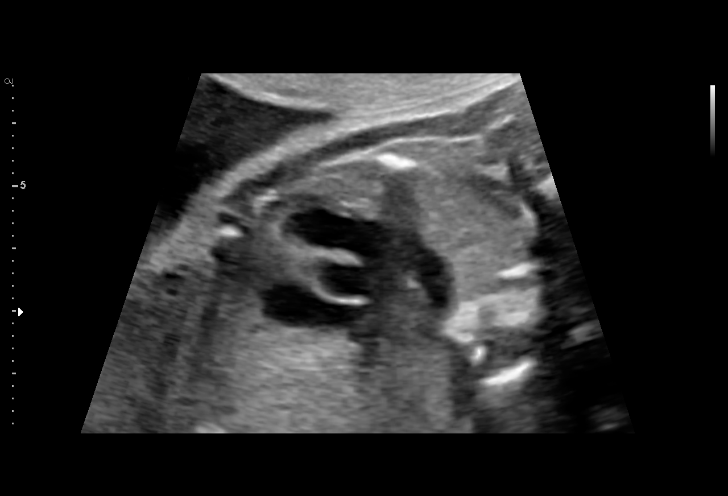
[im 25/62]
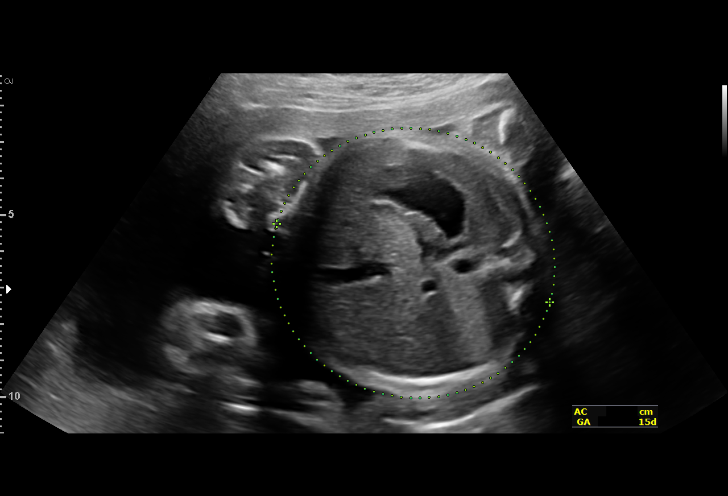
[im 32/62]
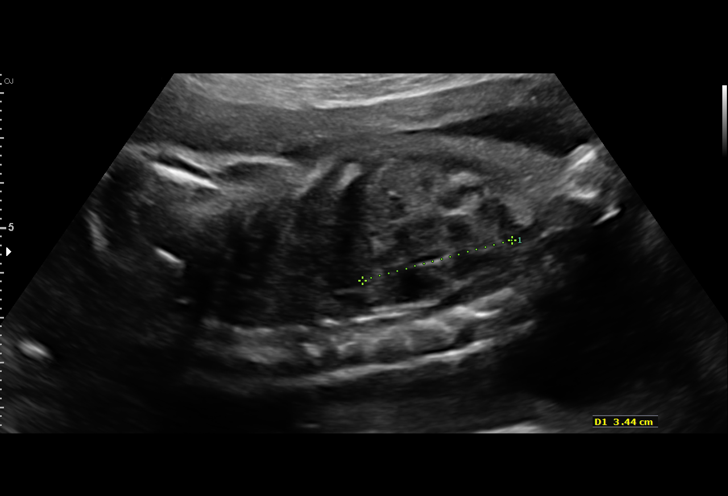
[im 37/62]
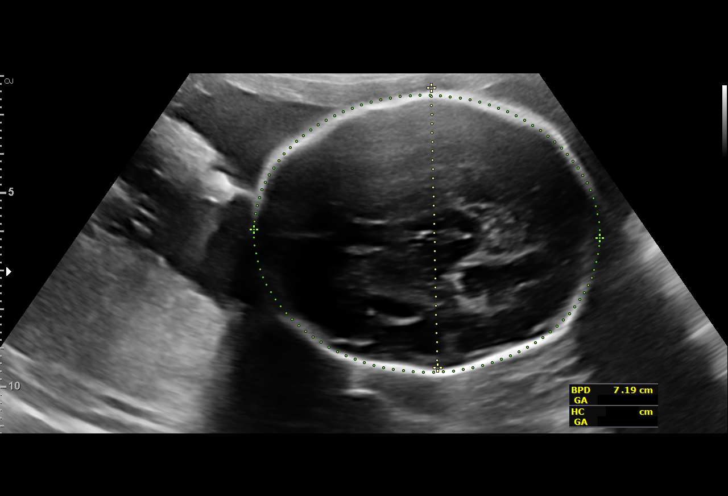
[im 41/62]
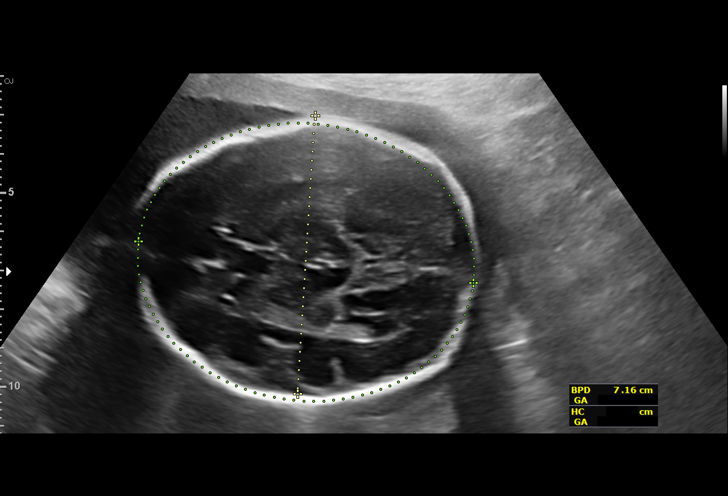
[im 46/62]
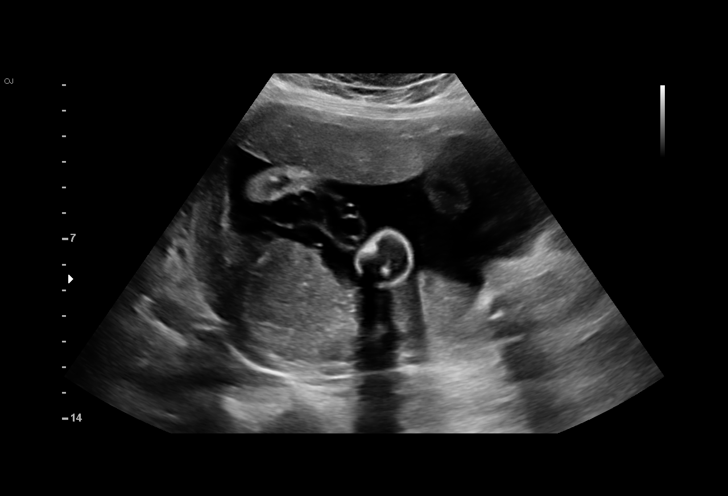
[im 50/62]
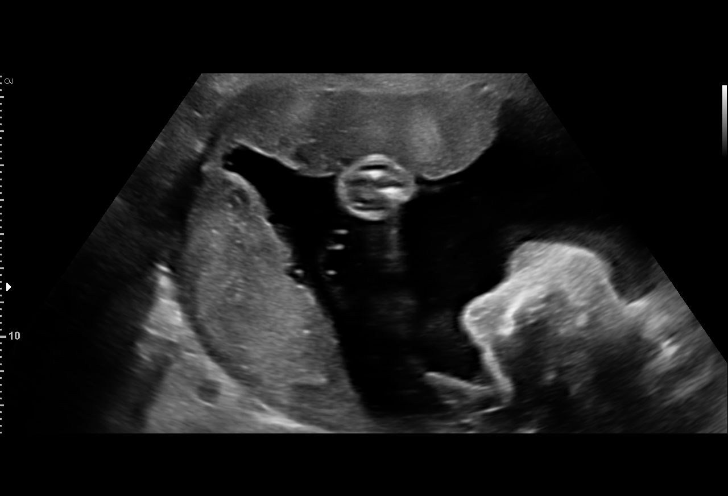
[im 55/62]
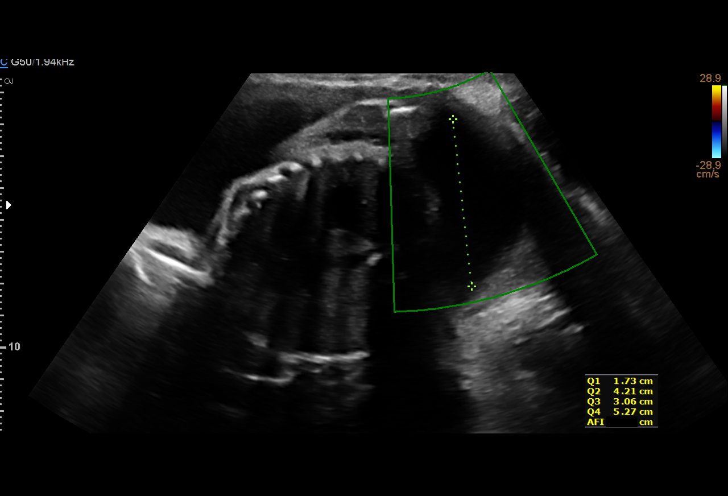
[im 59/62]
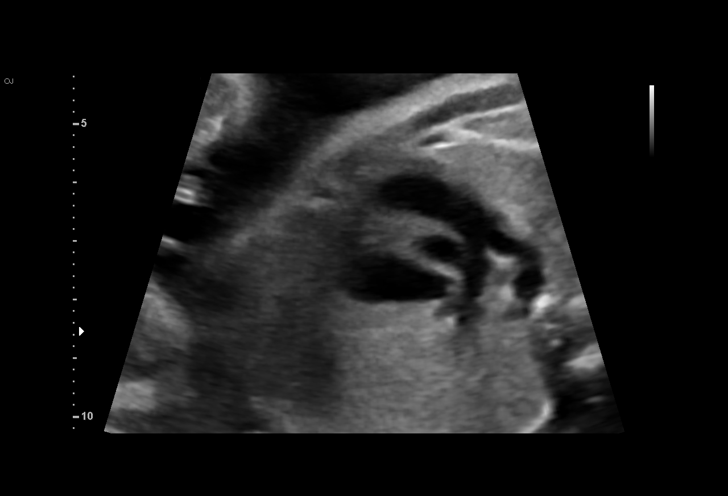

[13 of 28 positions shown; findings below may reference images not displayed]

Indications

28 weeks gestation of pregnancy
Encounter for antenatal screening for
malformations
Pre-eclampsia
OB History

Blood Type:            Height:  5'2"   Weight (lb):  180       BMI:
Gravidity:    3         Term:   1        Prem:   0        SAB:   1
TOP:          0       Ectopic:  0        Living: 1
Fetal Evaluation

Num Of Fetuses:     1
Cardiac Activity:   Observed
Presentation:       Breech
Placenta:           Fundal, above cervical os
P. Cord Insertion:  Visualized

Amniotic Fluid
AFI FV:      Subjectively within normal limits

AFI Sum(cm)     %Tile       Largest Pocket(cm)
14.27           47

RUQ(cm)       RLQ(cm)       LUQ(cm)        LLQ(cm)
1.73
Biometry

BPD:      71.3  mm     G. Age:  28w 4d         54  %    CI:        79.58   %    70 - 86
FL/HC:       19.9  %    18.8 -
HC:      252.6  mm     G. Age:  27w 3d          8  %    HC/AC:       1.08       1.05 -
AC:      234.3  mm     G. Age:  27w 5d         31  %    FL/BPD:      70.4  %    71 - 87
FL:       50.2  mm     G. Age:  27w 0d         10  %    FL/AC:       21.4  %    20 - 24
HUM:      45.4  mm     G. Age:  26w 6d         18  %
CER:      31.4  mm     G. Age:  27w 3d         39  %

Est. FW:    4038   gm     2 lb 6 oz     38  %
Gestational Age

Clinical EDD:  28w 1d                                        EDD:   01/22/17
U/S Today:     27w 5d                                        EDD:   01/25/17
Best:          28w 1d     Det. By:  Clinical EDD             EDD:   01/22/17
Anatomy

Cranium:               Appears normal         Aortic Arch:            Not well visualized
Cavum:                 Appears normal         Ductal Arch:            Not well visualized
Ventricles:            Appears normal         Diaphragm:              Appears normal
Choroid Plexus:        Appears normal         Stomach:                Appears normal, left
sided
Cerebellum:            Appears normal         Abdomen:                Appears normal
Posterior Fossa:       Appears normal         Abdominal Wall:         Not well visualized
Nuchal Fold:           Not applicable (>20    Cord Vessels:           Appears normal (3
wks GA)                                        vessel cord)
Face:                  Orbits nl; profile not Kidneys:                Appear normal
well visualized
Lips:                  Appears normal         Bladder:                Appears normal
Thoracic:              Appears normal         Spine:                  Not well visualized
Heart:                 Appears normal         Upper Extremities:      Appears normal
(4CH, axis, and situs
RVOT:                  Appears normal         Lower Extremities:      Appears normal
LVOT:                  Appears normal

Other:  Female gender. Technically difficult due to advanced GA and fetal
position.
Cervix Uterus Adnexa

Cervix
Length:           4.01  cm.
Normal appearance by transabdominal scan.

Uterus
No abnormality visualized.

Left Ovary
Not visualized. No adnexal mass visualized.

Right Ovary
Not visualized. No adnexal mass visualized.
Impression

SIUP at 49w3d, preeclampsia (remote read of ultrasound
only)
active singleton fetus in breech presentation
EFW 38th%'le
no dysmorphic features demonstrated
limited views as documented above
no previa
amniotic fluid volume is gestational age appropriate
Recommendations

Management as per OB provider noting that it is reasonable
to pursue delivery in setting of severe preeclampsia with end
organ involvement/lab abnormalities due to preeclampsia (eg,
previously normal platelet count dropping below 100,000 is
not stable for expectant management beyond 48 hours and
pursuing delivery after steroid administration is a reasonable
management in such cases).

## 2017-07-29 ENCOUNTER — Other Ambulatory Visit: Payer: Self-pay | Admitting: *Deleted

## 2017-07-29 DIAGNOSIS — G43009 Migraine without aura, not intractable, without status migrainosus: Secondary | ICD-10-CM

## 2017-07-29 MED ORDER — TOPIRAMATE 50 MG PO TABS: 50.0000 mg | ORAL_TABLET | Freq: Two times a day (BID) | ORAL | 1 refills | Status: DC

## 2017-10-27 ENCOUNTER — Other Ambulatory Visit: Payer: Self-pay | Admitting: *Deleted

## 2017-10-27 DIAGNOSIS — F411 Generalized anxiety disorder: Secondary | ICD-10-CM

## 2017-10-27 MED ORDER — CITALOPRAM HYDROBROMIDE 10 MG PO TABS: 15.0000 mg | ORAL_TABLET | Freq: Every day | ORAL | 1 refills | Status: DC

## 2018-01-21 ENCOUNTER — Other Ambulatory Visit: Payer: Self-pay | Admitting: Physician Assistant

## 2018-01-21 DIAGNOSIS — F411 Generalized anxiety disorder: Secondary | ICD-10-CM

## 2018-02-05 DIAGNOSIS — Z130 Encounter for screening for diseases of the blood and blood-forming organs and certain disorders involving the immune mechanism: Secondary | ICD-10-CM | POA: Diagnosis not present

## 2018-02-05 DIAGNOSIS — Z1389 Encounter for screening for other disorder: Secondary | ICD-10-CM | POA: Diagnosis not present

## 2018-02-05 DIAGNOSIS — Z01419 Encounter for gynecological examination (general) (routine) without abnormal findings: Secondary | ICD-10-CM | POA: Diagnosis not present

## 2018-02-05 DIAGNOSIS — Z6823 Body mass index (BMI) 23.0-23.9, adult: Secondary | ICD-10-CM | POA: Diagnosis not present

## 2018-03-05 ENCOUNTER — Other Ambulatory Visit: Payer: Self-pay | Admitting: Physician Assistant

## 2018-03-05 DIAGNOSIS — G43009 Migraine without aura, not intractable, without status migrainosus: Secondary | ICD-10-CM

## 2018-04-13 ENCOUNTER — Other Ambulatory Visit: Payer: Self-pay | Admitting: Physician Assistant

## 2018-04-13 DIAGNOSIS — G43009 Migraine without aura, not intractable, without status migrainosus: Secondary | ICD-10-CM

## 2018-04-24 ENCOUNTER — Other Ambulatory Visit: Payer: Self-pay | Admitting: Physician Assistant

## 2018-04-24 DIAGNOSIS — G43009 Migraine without aura, not intractable, without status migrainosus: Secondary | ICD-10-CM

## 2018-04-25 ENCOUNTER — Other Ambulatory Visit: Payer: Self-pay | Admitting: Physician Assistant

## 2018-12-27 ENCOUNTER — Telehealth (INDEPENDENT_AMBULATORY_CARE_PROVIDER_SITE_OTHER): Payer: BC Managed Care – PPO | Admitting: Physician Assistant

## 2018-12-27 ENCOUNTER — Encounter: Payer: Self-pay | Admitting: Physician Assistant

## 2018-12-27 ENCOUNTER — Telehealth: Payer: Self-pay | Admitting: Physician Assistant

## 2018-12-27 VITALS — BP 119/87 | Temp 96.7°F | Ht 62.0 in | Wt 130.0 lb

## 2018-12-27 DIAGNOSIS — G43009 Migraine without aura, not intractable, without status migrainosus: Secondary | ICD-10-CM

## 2018-12-27 DIAGNOSIS — F411 Generalized anxiety disorder: Secondary | ICD-10-CM | POA: Diagnosis not present

## 2018-12-27 MED ORDER — ELETRIPTAN HYDROBROMIDE 20 MG PO TABS: ORAL_TABLET | ORAL | 5 refills | Status: DC

## 2018-12-27 MED ORDER — TOPIRAMATE ER 100 MG PO CAP24
1.0000 | ORAL_CAPSULE | Freq: Every day | ORAL | 1 refills | Status: DC
Start: 2018-12-27 — End: 2019-06-21

## 2018-12-27 MED ORDER — CITALOPRAM HYDROBROMIDE 20 MG PO TABS: 20.0000 mg | ORAL_TABLET | Freq: Every day | ORAL | 1 refills | Status: DC

## 2018-12-27 NOTE — Telephone Encounter (Signed)
Approved today ([employer]ro[employer]) Effective from 12/27/2018 through 12/25/2021. Pharmacy aware.

## 2018-12-27 NOTE — Progress Notes (Deleted)
Patient needs refill on Relpax.  Wants to discuss increasing Celexa. PHQ2-GAD7 completed.

## 2018-12-27 NOTE — Progress Notes (Signed)
Patient ID: Crystal Armstrong, female   DOB: 15-Feb-1986, 33 y.o.   MRN: [id number] .Marland KitchenVirtual Visit via Video Note  I connected with Hoy Finlay on 12/27/18 at  9:30 AM EDT by a video enabled telemedicine application and verified that I am speaking with the correct person using two identifiers.  Location: Patient: home Provider: clinic   I discussed the limitations of evaluation and management by telemedicine and the availability of in person appointments. The patient expressed understanding and agreed to proceed.  History of Present Illness: Pt is a 33 yo female with anxiety and migraines. She needs medication refills.   She admits to feeling more anxious over the past few months. COVID pandemic has meant everyone home and her trying to work from home. She has noticed more problems sleeping. She feels more on edge. She would like to increase celexa. No SI/HC. She has tolerated celexa great.   Her migraine frequency has increased as well over the last few months. She has had 5 migraines in the last 2 weeks. relpax does help to abort. She is on topamax bid for prevention.   .. Active Ambulatory Problems    Diagnosis Date Noted  . Migraine without aura and without status migrainosus, not intractable 11/16/2015  . Anxiety state 11/16/2015  . GAD (generalized anxiety disorder) 01/03/2016  . High triglycerides 01/04/2016  . Chronic hypertension with superimposed preeclampsia 10/31/2016  . S/P primary low transverse C-section 11/02/2016  . Mid back pain 05/19/2017   Resolved Ambulatory Problems    Diagnosis Date Noted  . SOB (shortness of breath) 11/16/2015  . Acute bilateral low back pain without sciatica 05/19/2017   Past Medical History:  Diagnosis Date  . Allergy   . Migraine   . Pregnancy induced hypertension 2011   Reviewed med, allergy, problem list.     Observations/Objective: No acute distress. Normal mood.   .. Today's Vitals   12/27/18 0915  BP: 119/87  Temp:  (!) 96.7 F (35.9 C)  TempSrc: Oral  Weight: 130 lb (59 kg)  Height: 5\' 2"  (1.575 m)   Body mass index is 23.78 kg/m.  .. Depression screen [facility] 2/9 12/27/2018 02/10/2017 01/03/2016  Decreased Interest 0 0 0  Down, Depressed, Hopeless 0 0 0  PHQ - 2 Score 0 0 0   .Marland Kitchen GAD 7 : Generalized Anxiety Score 12/27/2018 02/10/2017 01/03/2016  Nervous, Anxious, on Edge 1 0 0  Control/stop worrying 0 1 1  Worry too much - different things 0 1 1  Trouble relaxing 0 0 1  Restless 0 0 0  Easily annoyed or irritable 0 0 0  Afraid - awful might happen 0 1 1  Total GAD 7 Score 1 3 4   Anxiety Difficulty Not difficult at all Somewhat difficult -     Assessment and Plan: Marland KitchenMarland KitchenClaris was seen today for anxiety.  Diagnoses and all orders for this visit:  Anxiety state -     citalopram (CELEXA) 20 MG tablet; Take 1 tablet (20 mg total) by mouth daily.  Migraine without aura and without status migrainosus, not intractable -     eletriptan (RELPAX) 20 MG tablet; TAKE 1 TABLET BY MOUTH AS NEEDED FOR MIGRAINE OR HEADACHE.MAY REPEAT IN 2HRS IF HEADACHE PERSIST -     Topiramate ER (TROKENDI XR) 100 MG CP24; Take 1 tablet by mouth at bedtime.  GAD (generalized anxiety disorder) -     citalopram (CELEXA) 20 MG tablet; Take 1 tablet (20 mg total) by mouth daily.  GAD-7 numbers did not change and look controlled. Pt reports more anxiety and trouble sleeping. Will increase celexa to 20mg  daily. Follow up in 6 weeks.   Stop topamax bid and start trokendi 100mg  XR. relpax as needed. Encouraged migraine diary to look for other triggers beside stress/anxiety.   Strongly encouraged exercise.   Follow Up Instructions:    I discussed the assessment and treatment plan with the patient. The patient was provided an opportunity to ask questions and all were answered. The patient agreed with the plan and demonstrated an understanding of the instructions.   The patient was advised to call back or seek an in-person  evaluation if the symptoms worsen or if the condition fails to improve as anticipated.   Tandy GawJade Breeback, PA-C

## 2019-01-06 ENCOUNTER — Other Ambulatory Visit: Payer: Self-pay | Admitting: Physician Assistant

## 2019-01-06 DIAGNOSIS — F411 Generalized anxiety disorder: Secondary | ICD-10-CM

## 2019-06-21 ENCOUNTER — Other Ambulatory Visit: Payer: Self-pay | Admitting: Physician Assistant

## 2019-06-21 DIAGNOSIS — G43009 Migraine without aura, not intractable, without status migrainosus: Secondary | ICD-10-CM

## 2019-06-21 DIAGNOSIS — F411 Generalized anxiety disorder: Secondary | ICD-10-CM

## 2019-07-15 DIAGNOSIS — G43009 Migraine without aura, not intractable, without status migrainosus: Secondary | ICD-10-CM | POA: Diagnosis not present

## 2019-07-15 DIAGNOSIS — F411 Generalized anxiety disorder: Secondary | ICD-10-CM | POA: Diagnosis not present

## 2019-07-27 DIAGNOSIS — N390 Urinary tract infection, site not specified: Secondary | ICD-10-CM | POA: Diagnosis not present

## 2019-08-12 DIAGNOSIS — Z03818 Encounter for observation for suspected exposure to other biological agents ruled out: Secondary | ICD-10-CM | POA: Diagnosis not present

## 2019-08-12 DIAGNOSIS — G43009 Migraine without aura, not intractable, without status migrainosus: Secondary | ICD-10-CM | POA: Diagnosis not present

## 2019-08-12 DIAGNOSIS — Z20828 Contact with and (suspected) exposure to other viral communicable diseases: Secondary | ICD-10-CM | POA: Diagnosis not present

## 2019-08-30 DIAGNOSIS — F411 Generalized anxiety disorder: Secondary | ICD-10-CM | POA: Diagnosis not present

## 2019-08-30 DIAGNOSIS — Z13220 Encounter for screening for lipoid disorders: Secondary | ICD-10-CM | POA: Diagnosis not present

## 2019-08-30 DIAGNOSIS — Z79899 Other long term (current) drug therapy: Secondary | ICD-10-CM | POA: Diagnosis not present

## 2019-08-30 DIAGNOSIS — Z0000 Encounter for general adult medical examination without abnormal findings: Secondary | ICD-10-CM | POA: Diagnosis not present

## 2019-08-30 DIAGNOSIS — E559 Vitamin D deficiency, unspecified: Secondary | ICD-10-CM | POA: Diagnosis not present

## 2019-08-30 DIAGNOSIS — G43009 Migraine without aura, not intractable, without status migrainosus: Secondary | ICD-10-CM | POA: Diagnosis not present

## 2019-09-18 ENCOUNTER — Other Ambulatory Visit: Payer: Self-pay | Admitting: Physician Assistant

## 2019-09-18 DIAGNOSIS — G43009 Migraine without aura, not intractable, without status migrainosus: Secondary | ICD-10-CM

## 2019-09-19 ENCOUNTER — Other Ambulatory Visit: Payer: Self-pay

## 2019-11-15 ENCOUNTER — Other Ambulatory Visit: Payer: Self-pay | Admitting: Physician Assistant

## 2019-11-15 DIAGNOSIS — G43009 Migraine without aura, not intractable, without status migrainosus: Secondary | ICD-10-CM

## 2019-12-17 ENCOUNTER — Other Ambulatory Visit: Payer: Self-pay | Admitting: Physician Assistant

## 2019-12-17 DIAGNOSIS — G43009 Migraine without aura, not intractable, without status migrainosus: Secondary | ICD-10-CM

## 2020-01-22 ENCOUNTER — Other Ambulatory Visit: Payer: Self-pay | Admitting: Physician Assistant

## 2020-01-22 DIAGNOSIS — G43009 Migraine without aura, not intractable, without status migrainosus: Secondary | ICD-10-CM

## 2020-03-02 DIAGNOSIS — F411 Generalized anxiety disorder: Secondary | ICD-10-CM | POA: Diagnosis not present

## 2020-03-02 DIAGNOSIS — G43009 Migraine without aura, not intractable, without status migrainosus: Secondary | ICD-10-CM | POA: Diagnosis not present

## 2020-03-02 DIAGNOSIS — E559 Vitamin D deficiency, unspecified: Secondary | ICD-10-CM | POA: Diagnosis not present

## 2020-03-02 DIAGNOSIS — E7800 Pure hypercholesterolemia, unspecified: Secondary | ICD-10-CM | POA: Diagnosis not present

## 2020-10-11 DIAGNOSIS — E559 Vitamin D deficiency, unspecified: Secondary | ICD-10-CM | POA: Diagnosis not present

## 2020-10-11 DIAGNOSIS — F411 Generalized anxiety disorder: Secondary | ICD-10-CM | POA: Diagnosis not present

## 2020-10-11 DIAGNOSIS — G43009 Migraine without aura, not intractable, without status migrainosus: Secondary | ICD-10-CM | POA: Diagnosis not present

## 2020-10-11 DIAGNOSIS — E7800 Pure hypercholesterolemia, unspecified: Secondary | ICD-10-CM | POA: Diagnosis not present
